# Patient Record
Sex: Male | Born: 1990 | Race: Black or African American | Hispanic: No | Marital: Single | Smoking: Former smoker
Health system: Southern US, Community
[De-identification: ages and names within clinical notes are randomized; demographics above are authoritative.]

## PROBLEM LIST (undated history)

## (undated) DIAGNOSIS — F32A Depression, unspecified: Secondary | ICD-10-CM

## (undated) DIAGNOSIS — F209 Schizophrenia, unspecified: Secondary | ICD-10-CM

## (undated) DIAGNOSIS — F329 Major depressive disorder, single episode, unspecified: Secondary | ICD-10-CM

## (undated) DIAGNOSIS — J309 Allergic rhinitis, unspecified: Secondary | ICD-10-CM

## (undated) DIAGNOSIS — J020 Streptococcal pharyngitis: Secondary | ICD-10-CM

## (undated) HISTORY — DX: Allergic rhinitis, unspecified: J30.9

## (undated) HISTORY — DX: Streptococcal pharyngitis: J02.0

## (undated) HISTORY — PX: ORCHIOPEXY: SHX479

---
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
## 2013-04-08 DIAGNOSIS — J020 Streptococcal pharyngitis: Secondary | ICD-10-CM

## 2013-04-08 HISTORY — DX: Streptococcal pharyngitis: J02.0

## 2013-04-28 ENCOUNTER — Emergency Department (HOSPITAL_COMMUNITY)
Admission: EM | Admit: 2013-04-28 | Discharge: 2013-04-28 | Disposition: A | Discharge status: Home / Self Care | Payer: Medicaid Other | Attending: Emergency Medicine | Admitting: Emergency Medicine

## 2013-04-28 ENCOUNTER — Encounter (HOSPITAL_COMMUNITY): Payer: Self-pay | Admitting: Emergency Medicine

## 2013-04-28 DIAGNOSIS — J020 Streptococcal pharyngitis: Secondary | ICD-10-CM | POA: Insufficient documentation

## 2013-04-28 LAB — RAPID STREP SCREEN (MED CTR MEBANE ONLY): Streptococcus, Group A Screen (Direct): POSITIVE — AB

## 2013-04-28 MED ORDER — PENICILLIN G BENZATHINE 1200000 UNIT/2ML IM SUSP
1.2000 10*6.[IU] | Freq: Once | INTRAMUSCULAR | Status: AC
  Administered 2013-04-28: 1.2 10*6.[IU] via INTRAMUSCULAR
  Filled 2013-04-28: qty 2

## 2013-04-28 MED ORDER — IBUPROFEN 400 MG PO TABS
800.0000 mg | ORAL_TABLET | Freq: Once | ORAL | Status: AC
  Administered 2013-04-28: 800 mg via ORAL
  Filled 2013-04-28: qty 2

## 2013-04-28 NOTE — ED Provider Notes (Signed)
CSN: [id number]     Arrival date & time 04/28/13  1755 History   This chart was scribed for non-physician practitioner, Coral CeoJessica Qusay Villada, PA-C, working with Gavin PoundMichael Y. Oletta LamasGhim, MD by Smiley HousemanFallon Davis, ED Scribe. This patient was seen in room TR11C/TR11C and the patient's care was started at 6:36 PM.  Chief Complaint  Patient presents with  . Sore Throat   The history is provided by the patient. No language interpreter was used.   HPI Comments: [name] is a 23 y.o. male with no PMH who presents to the Emergency Department complaining of a constant worsening sore throat that started about 4 days ago.  He states he had a subjective fever at home.  ED temperature is 99.63F.  He denies cough, otalgia, nausea, emesis, and abdominal pain.  Pt denies taking anything for symptoms relief.  Pt denies any sick contacts at home.  He states he is otherwise healthy.    History reviewed. No pertinent past medical history. History reviewed. No pertinent past surgical history. No family history on file. History  Substance Use Topics  . Smoking status: Never Smoker   . Smokeless tobacco: Not on file  . Alcohol Use: No    Review of Systems  Constitutional: Positive for fever (low grade). Negative for chills, activity change and appetite change.  HENT: Positive for sore throat. Negative for congestion, ear pain, rhinorrhea, trouble swallowing and voice change.   Respiratory: Negative for cough and wheezing.   Cardiovascular: Negative for chest pain.  Gastrointestinal: Negative for nausea, vomiting, abdominal pain and diarrhea.  Musculoskeletal: Negative for myalgias.  Skin: Negative for rash.  Neurological: Negative for headaches.  All other systems reviewed and are negative.   Allergies  Review of patient's allergies indicates no known allergies.  Home Medications   Prior to Admission medications   Not on File   Triage Vitals: BP 159/92  Pulse 94  Temp(Src) 99 F (37.2 C) (Oral)  Resp 18   Ht 6\' 1"  (1.854 m)  Wt 216 lb (97.977 kg)  BMI 28.50 kg/m2  SpO2 99%  Filed Vitals:   04/28/13 1804 04/28/13 1845  BP: 159/92 147/83  Pulse: 94 85  Temp: 99 F (37.2 C)   TempSrc: Oral   Resp: 18 16  Height: 6\' 1"  (1.854 m)   Weight: 216 lb (97.977 kg)   SpO2: 99% 100%    Physical Exam  Nursing note and vitals reviewed. Constitutional: He is oriented to person, place, and time. He appears well-developed and well-nourished. No distress.  Non-toxic  HENT:  Head: Normocephalic and atraumatic.  Right Ear: External ear normal.  Left Ear: External ear normal.  Nose: Nose normal.  Mouth/Throat: Oropharynx is clear and moist.  Erythema, edema and exudates to the tonsils bilaterally. Tonsils 2+ bilaterally. Uvula midline. No trismus. No difficulty controlling secretions. Tympanic membranes gray and translucent bilaterally with no erythema, edema, or hemotympanum.  No mastoid or tragal tenderness bilaterally.   Eyes: Conjunctivae are normal. Pupils are equal, round, and reactive to light. Right eye exhibits no discharge. Left eye exhibits no discharge.  Neck: Normal range of motion. Neck supple.  No cervical lymphadenopathy. No nuchal rigidity.   Cardiovascular: Normal rate, regular rhythm and normal heart sounds.  Exam reveals no gallop and no friction rub.   No murmur heard. Pulmonary/Chest: Effort normal and breath sounds normal. No respiratory distress. He has no wheezes. He has no rales. He exhibits no tenderness.  Abdominal: Soft. He exhibits no distension. There is  no tenderness.  Musculoskeletal: Normal range of motion. He exhibits no edema and no tenderness.  Neurological: He is alert and oriented to person, place, and time.  Skin: Skin is warm and dry. He is not diaphoretic.     ED Course  Procedures (including critical care time) DIAGNOSTIC STUDIES:. Oxygen Saturation is 99% on RA, normal by my interpretation.    COORDINATION OF CARE: 6:40 PM-Informed pt his strep  screen was positive.  Will order penicillin injection and ibuprofen.  Patient informed of current plan of treatment and evaluation and agrees with plan.    Results for orders placed during the hospital encounter of 04/28/13  RAPID STREP SCREEN      Result Value Ref Range   Streptococcus, Group A Screen (Direct) POSITIVE (*) NEGATIVE    MDM   [name] is a 23 y.o. male with no PMH who presents to the Emergency Department complaining of a constant worsening sore throat that started about 4 days ago, which is likely due to strep pharyngitis. Rapid strep positive. Patient treated with IM penicillin in the ED. Patient afebrile and non-toxic in appearance. No evidence of peritonsillar or retropharyngeal abscess. Able to tolerate fluids without difficulty. Instructed to follow-up with PCP if symptoms not improving or worsening. Return precautions, discharge instructions, and follow-up was discussed with the patient before discharge.     There are no discharge medications for this patient.   Final impressions: 1. Strep pharyngitis       Greer EeJessica Katlin Launa Goedken PA-C           Jillyn LedgerJessica K Kayci [place], PA-C 05/01/13 2013

## 2013-04-28 NOTE — Discharge Instructions (Signed)
Drink fluids and rest  Take Ibuprofen for pain  Follow-up with your doctor if your symptoms are not improving or worsening  Return to the emergency department if you develop any changing/worsening condition, fever, difficulty swallowing/breathing, or any other concerns (please read additional information regarding your condition below)    Strep Throat Strep throat is an infection of the throat caused by a bacteria named Streptococcus pyogenes. Your caregiver may call the infection streptococcal "tonsillitis" or "pharyngitis" depending on whether there are signs of inflammation in the tonsils or back of the throat. Strep throat is most common in children aged 5 15 years during the cold months of the year, but it can occur in people of any age during any season. This infection is spread from person to person (contagious) through coughing, sneezing, or other close contact. SYMPTOMS   Fever or chills.  Painful, swollen, red tonsils or throat.  Pain or difficulty when swallowing.  White or yellow spots on the tonsils or throat.  Swollen, tender lymph nodes or "glands" of the neck or under the jaw.  Red rash all over the body (rare). DIAGNOSIS  Many different infections can cause the same symptoms. A test must be done to confirm the diagnosis so the right treatment can be given. A "rapid strep test" can help your caregiver make the diagnosis in a few minutes. If this test is not available, a light swab of the infected area can be used for a throat culture test. If a throat culture test is done, results are usually available in a day or two. TREATMENT  Strep throat is treated with antibiotic medicine. HOME CARE INSTRUCTIONS   Gargle with 1 tsp of salt in 1 cup of warm water, 3 4 times per day or as needed for comfort.  Family members who also have a sore throat or fever should be tested for strep throat and treated with antibiotics if they have the strep infection.  Make sure everyone in  your household washes their hands well.  Do not share food, drinking cups, or personal items that could cause the infection to spread to others.  You may need to eat a soft food diet until your sore throat gets better.  Drink enough water and fluids to keep your urine clear or pale yellow. This will help prevent dehydration.  Get plenty of rest.  Stay home from school, daycare, or work until you have been on antibiotics for 24 hours.  Only take over-the-counter or prescription medicines for pain, discomfort, or fever as directed by your caregiver.  If antibiotics are prescribed, take them as directed. Finish them even if you start to feel better. SEEK MEDICAL CARE IF:   The glands in your neck continue to enlarge.  You develop a rash, cough, or earache.  You cough up green, yellow-brown, or bloody sputum.  You have pain or discomfort not controlled by medicines.  Your problems seem to be getting worse rather than better. SEEK IMMEDIATE MEDICAL CARE IF:   You develop any new symptoms such as vomiting, severe headache, stiff or painful neck, chest pain, shortness of breath, or trouble swallowing.  You develop severe throat pain, drooling, or changes in your voice.  You develop swelling of the neck, or the skin on the neck becomes red and tender.  You have a fever.  You develop signs of dehydration, such as fatigue, dry mouth, and decreased urination.  You become increasingly sleepy, or you cannot wake up completely. Document Released: 12/23/1999 Document  Revised: 12/12/2011 Document Reviewed: 02/23/2010 [facility] Patient Information 2014 [place], [place].   Emergency Department Resource Guide 1) Find a Doctor and Pay Out of Pocket Although you won't have to find out who is covered by your insurance plan, it is a good idea to ask around and get recommendations. You will then need to call the office and see if the doctor you have chosen will accept you as a new patient and what  types of options they offer for patients who are self-pay. Some doctors offer discounts or will set up payment plans for their patients who do not have insurance, but you will need to ask so you aren't surprised when you get to your appointment.  2) Contact Your Local Health Department Not all health departments have doctors that can see patients for sick visits, but many do, so it is worth a call to see if yours does. If you don't know where your local health department is, you can check in your phone book. The CDC also has a tool to help you locate your state's health department, and many state websites also have listings of all of their local health departments.  3) Find a Walk-in Clinic If your illness is not likely to be very severe or complicated, you may want to try a walk in clinic. These are popping up all over the country in pharmacies, drugstores, and shopping centers. They're usually staffed by nurse practitioners or physician assistants that have been trained to treat common illnesses and complaints. They're usually fairly quick and inexpensive. However, if you have serious medical issues or chronic medical problems, these are probably not your best option.  No Primary Care Doctor: - Call Health Connect at  [phone] - they can help you locate a primary care doctor that  accepts your insurance, provides certain services, etc. - Physician Referral Service- [phone]  Chronic Pain Problems: Organization         Address  Phone   Notes  [facility]  [phone] Patients need to be referred by their primary care doctor.   Medication Assistance: Organization         Address  Phone   Notes  [facility] Medication [facility] [address] [place], [place] [address] [phone] --Must be a resident of [facility] -- Must have NO insurance coverage whatsoever (no Medicaid/ Medicare, etc.) -- The pt. MUST have a primary care doctor that  directs their care regularly and follows them in the community   [facility]ist  [phone]   [employer]  [phone]    Agencies that provide inexpensive medical care: Organization         Address  Phone   Notes  [facility]  [phone]   [facility]    [phone]   [facility] [address] [place], [place] [address] [phone]   [facility] [address], [place] [phone]   Planned Parenthood    [phone]   [facility]    [phone]   Community Health and [facility]  [address], [place] Phone:  [phone], Fax:  [phone] Hours of Operation:  9 am - 6 pm, M-F.  Also accepts Medicaid/Medicare and self-pay.  [facility]  [address], [place] Phone: [phone], Fax: [phone]. Hours of Operation:  8:30 am -  5:30 pm, M-F.  Also accepts Medicaid and self-pay.  [facility] [place] [address], [place] Phone: [phone]   [facility] [address] Natasha Bence [place], [place] [phone], Ext. [phone] Mondays & Thursdays: 7-9 AM.  First 15 patients are seen on a first come, first serve basis.    Medicaid-accepting [facility] Providers:  Organization         Address  Phone   Notes  [facility] [address], [place] [phone] Also accepts self-pay patients.  [facility] [address] Laurell Josephs [place], [place]  [phone]   [facility] [address], [place] [phone]   [facility] [address], [place] [phone]   Renaye Rakers [address], [place]   [phone] Only accepts [place] patients after they have their name applied to their card.   Self-Pay (no insurance) in [facility]:  Organization          Address  Phone   Notes  Sickle Cell Patients, [facility] [address] [place], [place] [phone]   [facility] Urgent Care [address] [place], [place] [phone]   [facility] [place]  [address],  [phone]   [facility]/Dr. Osei-Bonsu  [address], [place] or [address], [place] [phone] Phone number for both [place] and [place] locations is the same.  [facility]gent [facility] and [facility] [address] [place] [phone]   [facility] [address] [place] or [address] [phone]   [facility] [address] [place] [phone], phone; [phone], fax Sees patients 1st and 3rd Saturday of every month.  Must not qualify for public or private insurance (i.e. Medicaid, Medicare, [place] Health Choice, Veterans' Benefits)  Household income should be no more than 200% of the poverty level The clinic cannot treat you if you are pregnant or think you are pregnant  Sexually transmitted diseases are not treated at the clinic.    Dental Care: Organization         Address  Phone  Notes  [facility] Department of [facility] [address] [place], [place] [phone] Accepts children up to age 19 who are enrolled in [place] or [place] Health Choice; pregnant women with a Medicaid card; and children who have applied for Medicaid or [place] Health Choice, but were declined, whose parents can pay a reduced fee at time of service.  [facility] Department of [facility]  [address], [place] [phone] Accepts children up to age 10 who are enrolled in [place] or [place] Health Choice; pregnant women with a Medicaid card; and children who have applied for Medicaid or [place] Health Choice, but were declined, whose parents can pay a reduced fee at time of  service.  [facility]  [address] [place], [place] [phone] Patients are seen by appointment only. Walk-ins are not accepted. [facility] will see patients 36 years of age and older. Monday - Tuesday (8am-5pm) Most Wednesdays (8:30-5pm) $30 per visit, cash only  [facility] Adult Dental Access PROGRAM  [address], [facility] [phone] Patients are seen by appointment only. Walk-ins are  not accepted. [facility] will see patients 34 years of age and older. One Wednesday Evening (Monthly: Volunteer Based).  $30 per visit, cash only  [employer]  [phone] for adults; Children under age 60, call Graduate Pediatric Dentistry at [phone]. Children aged 71-14, please call [phone] to request a pediatric application.  Dental services are provided in all areas of dental care including fillings, crowns and bridges, complete and partial dentures, implants, gum treatment, root canals, and extractions. Preventive care is also provided. Treatment is provided to both adults and children. Patients are selected via a lottery and there is often a waiting list.   [facility] [address], [place]  [phone] [email or web address]   [facility] [address] [place], [place] [phone], Ext. [phone] Second and Fourth Thursday of each month, opens at 6:30 AM; Clinic ends at 9 AM.  Patients are seen on a first-come first-served basis, and a limited number are seen during each clinic.   [facility]  [address] Ether Griffins [place], [place] [phone]   Eligibility Requirements You must have lived in [place], [place], or [place] counties for at least the last three months.   You cannot be eligible for state or federal sponsored [place], including [employer], [place], or [employer].   You generally cannot be eligible for healthcare insurance through your employer.     How to apply: Eligibility screenings are held every Tuesday and Wednesday afternoon from 1:00 pm until 4:00 pm. You do not need an appointment for the interview!  [facility] [address], [place], [place] [phone]   [facility] Health Department  [phone]   [facility] Health Department  [phone]   [facility] Health Department  [phone]    Behavioral Health Resources in the Community: Intensive Outpatient Programs Organization         Address  Phone  Notes  [facility] [address], [place], [place] [phone]   [facility] Outpatient [address], [place], [place] [phone]   [facility] [address], [place], [place]  [phone]   [facility] Mental Health [address]  [place], [place] [phone] or [phone]   Substance Abuse Resources Organization         Address  Phone  Notes  [facility] Drug Services  [phone]   [facility]  [phone]   [facility]  [phone]   Floydene Flock  [phone]   Residential & Outpatient Substance Abuse Program  [phone]   Psychological Services Organization         Address  Phone  Notes  [facility] Behavioral Health  [phone]   [facility]  [phone]   [facility] Mental Health [address], [place] [phone] or [phone]    Mobile Crisis Teams Organization         Address  Phone  Notes  [facility], Mobile Crisis Care Unit  [phone]   [facility]  [address] [place], [place] [phone]   Doristine Locks [address] [place] [phone]    Self-Help/Support Groups Organization         Address  Phone             Notes  Mental Health Assoc. of  - variety of support groups  336- I7437963 Call for more information  Narcotics Anonymous (NA), Caring  Services [address], [place]  2 meetings at this location   Residential Sports administratorTreatment Programs Organization         Address  Phone  Notes  [facility] [address],    [place]  [phone]   [facility]  [address], [place] [phone], [place], [place] [phone]   [facility] [address], [place] [phone] Admissions: 8am-3pm M-F  [facility] [address]    [place], [place] [phone]   [facility] [address], [place], [place] [phone]   [facility] [address]  [place], [place] [phone]   [facility] - Intensive Outpatient [address], [place], [place] [phone]   [facility] ([facility] [address]  [place], [place] [phone] or [phone]   [facility] ([facility]) [address] [place], [place] [phone] Accepts Medicaid  [facility] [address]  [place] [phone] Substance Abuse/Addiction Treatment   [facility] Behavioral Health Resources Organization         Address  Phone  Notes  [facility]  [phone](888) [phone]   Angie FavaJulie Brannon, PhD [address] [place], [place]   [phone](336) [phone] or [phone](336) [phone]   [facility]   [address] [place], [place] [phone](336) [phone]   [facility] [address], [place], [place] [phone](336) [phone] Insurance/Medicaid/sponsorship through [facility]  [facility] [address]                                    [place], [place] [phone](336) [phone] Therapy/tele-psych/case  [facility] [address]   [place], [place] [phone](336) [phone]    Dr. Lolly MustacheArfeen  [phone](336) [phone]   [facility] [place]  [employer] [facility] 1) [address], [place] 2) [address], [place] 3)  [address], [place] [phone](336) [phone](336) [phone]  [phone](336) [phone]   [facility] Child Abuse  Hotline [phone](336) [phone] or [phone](336) [phone] (After Hours)

## 2013-04-28 NOTE — ED Notes (Signed)
Onset Saturday morning sore throat, fever.  No cold/cough symptoms.  No fever since yesterday.  Pain getting worse.  Mouth moist, urinating WNL.  No other s/s noted.

## 2013-04-28 NOTE — ED Notes (Signed)
Pt states he believes he has strep throat.  Pt c/o sore throat that started Saturday.

## 2013-04-28 NOTE — ED Notes (Signed)
Drank glass of water with no difficulties. No c/o nausea.

## 2013-05-02 NOTE — ED Provider Notes (Signed)
Medical screening examination/treatment/procedure(s) were performed by non-physician practitioner and as supervising physician I was immediately available for consultation/collaboration.  Nahal Wanless Y. Kaeson Kleinert, MD 05/02/13 1420 

## 2013-09-16 ENCOUNTER — Ambulatory Visit (INDEPENDENT_AMBULATORY_CARE_PROVIDER_SITE_OTHER): Payer: No Typology Code available for payment source | Admitting: Medical

## 2013-09-16 ENCOUNTER — Encounter: Payer: Self-pay | Admitting: Medical

## 2013-09-16 VITALS — BP 120/70 | HR 66 | Temp 97.9°F | Resp 16 | Ht 71.0 in | Wt 183.0 lb

## 2013-09-16 DIAGNOSIS — M67432 Ganglion, left wrist: Secondary | ICD-10-CM

## 2013-09-16 DIAGNOSIS — R519 Headache, unspecified: Secondary | ICD-10-CM

## 2013-09-16 DIAGNOSIS — H6590 Unspecified nonsuppurative otitis media, unspecified ear: Secondary | ICD-10-CM

## 2013-09-16 DIAGNOSIS — J329 Chronic sinusitis, unspecified: Secondary | ICD-10-CM

## 2013-09-16 DIAGNOSIS — Z23 Encounter for immunization: Secondary | ICD-10-CM

## 2013-09-16 DIAGNOSIS — R51 Headache: Secondary | ICD-10-CM

## 2013-09-16 DIAGNOSIS — R0982 Postnasal drip: Secondary | ICD-10-CM

## 2013-09-16 DIAGNOSIS — H6593 Unspecified nonsuppurative otitis media, bilateral: Secondary | ICD-10-CM

## 2013-09-16 DIAGNOSIS — R21 Rash and other nonspecific skin eruption: Secondary | ICD-10-CM

## 2013-09-16 DIAGNOSIS — Z0000 Encounter for general adult medical examination without abnormal findings: Secondary | ICD-10-CM

## 2013-09-16 DIAGNOSIS — Z113 Encounter for screening for infections with a predominantly sexual mode of transmission: Secondary | ICD-10-CM

## 2013-09-16 DIAGNOSIS — M6740 Ganglion, unspecified site: Secondary | ICD-10-CM

## 2013-09-16 LAB — POCT URINALYSIS DIPSTICK
BILIRUBIN UA: NEGATIVE
Blood, UA: NEGATIVE
GLUCOSE UA: NEGATIVE
Ketones, UA: NEGATIVE
Leukocytes, UA: NEGATIVE
NITRITE UA: NEGATIVE
Protein, UA: NEGATIVE
SPEC GRAV UA: 1.025
Urobilinogen, UA: NEGATIVE
pH, UA: 5

## 2013-09-16 LAB — LIPID PANEL
CHOL/HDL RATIO: 3.7 ratio
Cholesterol: 180 mg/dL (ref 0–200)
HDL: 49 mg/dL (ref >39)
LDL CALC: 112 mg/dL — AB (ref 0–99)
Triglycerides: 93 mg/dL (ref <150)
VLDL: 19 mg/dL (ref 0–40)

## 2013-09-16 LAB — COMPREHENSIVE METABOLIC PANEL
ALBUMIN: 5 g/dL (ref 3.5–5.2)
ALT: 13 U/L (ref 0–53)
AST: 16 U/L (ref 0–37)
Alkaline Phosphatase: 83 U/L (ref 39–117)
BUN: 12 mg/dL (ref 6–23)
CHLORIDE: 104 meq/L (ref 96–112)
CO2: 27 meq/L (ref 19–32)
Calcium: 10.7 mg/dL — ABNORMAL HIGH (ref 8.4–10.5)
Creat: 0.9 mg/dL (ref 0.50–1.35)
Glucose, Bld: 71 mg/dL (ref 70–99)
Potassium: 3.9 mEq/L (ref 3.5–5.3)
Sodium: 140 mEq/L (ref 135–145)
Total Bilirubin: 0.8 mg/dL (ref 0.2–1.2)
Total Protein: 7.5 g/dL (ref 6.0–8.3)

## 2013-09-16 MED ORDER — TERBINAFINE HCL 1 % EX CREA: 1.0000 "application " | TOPICAL_CREAM | Freq: Two times a day (BID) | CUTANEOUS | Status: DC

## 2013-09-16 MED ORDER — AMOXICILLIN 875 MG PO TABS: 875.0000 mg | ORAL_TABLET | Freq: Two times a day (BID) | ORAL | Status: DC

## 2013-09-16 NOTE — Patient Instructions (Signed)
Thank you for giving me the opportunity to serve you today.    Your diagnosis today includes: Encounter Diagnoses  Name Primary?  . Routine general medical examination at a health care facility Yes  . Ganglion cyst of wrist, left   . Rash and nonspecific skin eruption   . Bilateral serous otitis media, recurrence not specified, unspecified chronicity   . Headache, unspecified headache type   . Post-nasal drainage   . Screen for STD (sexually transmitted disease)      Specific recommendations today include:  Headaches, drainage down the back of the throat, mucus - begin amoxicillin twice a day for 10 days, begin either Benadryl or Mucinex over-the-counter for the next 5-10 days, increase water intake, and if not much improved within 10 days let me know  If still having headaches 2-3 weeks from now, then certainly recheck   we are doing baseline lab work today  Begin Lamisil cream for likely ringworm of your left abdomen.  It may take 2-3 weeks for this to resolve  The left wrist knot is a ganglion cyst.  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   I have included other useful information below for your review.  Sexually Transmitted Disease Sexually transmitted disease (STD) refers to any infection that is passed from person to person during sexual activity. This may happen by way of saliva, semen, blood, vaginal mucus, or urine. Common STDs include:  Gonorrhea.   Chlamydia.   Syphilis.   HIV/AIDS.   Genital herpes.   Hepatitis B and C.   Trichomonas.   Human papillomavirus (HPV).   Pubic lice.  CAUSES  An STD may be spread by bacteria, virus, or parasite. A person can get an STD by:  Sexual intercourse with an infected person.   Sharing sex toys with an infected person.   Sharing needles with an infected  person.   Having intimate contact with the genitals, mouth, or rectal areas of an infected person.  SYMPTOMS  Some people may not have any symptoms, but they can still pass the infection to others. Different STDs have different symptoms. Symptoms include:  Painful or bloody urination.   Pain in the pelvis, abdomen, vagina, anus, throat, or eyes.   Skin rash, itching, irritation, growths, or sores (lesions). These usually occur in the genital or anal area.   Abnormal vaginal discharge.   Penile discharge in men.   Soft, flesh-colored skin growths in the genital or anal area.   Fever.   Pain or bleeding during sexual intercourse.   Swollen glands in the groin area.   Yellow skin and eyes (jaundice). This is seen with hepatitis.  DIAGNOSIS  To make a diagnosis, your caregiver may:  Take a medical history.   Perform a physical exam.   Take a specimen (culture) to be examined.   Examine a sample of discharge under a microscope.   Perform blood tests.   Perform a Pap test, if this applies.   Perform a colposcopy.   Perform a laparoscopy.  TREATMENT   Chlamydia, gonorrhea, trichomonas, and syphilis can be cured with antibiotic medicine.   Genital herpes, hepatitis, and HIV can be treated, but not cured, with prescribed medicines. The medicines will lessen the symptoms.   Genital warts from HPV can be treated with medicine or by freezing,  burning (electrocautery), or surgery. Warts may come back.   HPV is a virus and cannot be cured with medicine or surgery.However, abnormal areas may be followed very closely by your caregiver and may be removed from the cervix, vagina, or vulva through office procedures or surgery.  If your diagnosis is confirmed, your recent sexual partners need treatment. This is true even if they are symptom-free or have a negative culture or evaluation. They should not have sex until their caregiver says it is okay. HOME CARE INSTRUCTIONS  All  sexual partners should be informed, tested, and treated for all STDs.   Take your antibiotics as directed. Finish them even if you start to feel better.   Only take over-the-counter or prescription medicines for pain, discomfort, or fever as directed by your caregiver.   Rest.   Eat a balanced diet and drink enough fluids to keep your urine clear or pale yellow.   Do not have sex until treatment is completed and you have followed up with your caregiver. STDs should be checked after treatment.   Keep all follow-up appointments, Pap tests, and blood tests as directed by your caregiver.   Only use latex condoms and water-soluble lubricants during sexual activity. Do not use petroleum jelly or oils.   Avoid alcohol and illegal drugs.   Get vaccinated for HPV and hepatitis. If you have not received these vaccines in the past, talk to your caregiver about whether one or both might be right for you.   Avoid risky sex practices that can break the skin.  The only way to avoid getting an STD is to avoid all sexual activity.Latex condoms and dental dams (for oral sex) will help lessen the risk of getting an STD, but will not completely eliminate the risk. SEEK MEDICAL CARE IF:   You have a fever.   You have any new or worsening symptoms.  Document Released: 03/17/2002 Document Revised: 09/06/2010 Document Reviewed: 03/24/2010 [facility] Patient Information 2012 [place], [place].    Body Ringworm Ringworm (tinea corporis) is a fungal infection of the skin on the body. This infection is not caused by worms, but is actually caused by a fungus. Fungus normally lives on the top of your skin and can be useful. However, in the case of ringworms, the fungus grows out of control and causes a skin infection. It can involve any area of skin on the body and can spread easily from one person to another (contagious). Ringworm is a common problem for children, but it can affect adults as well. Ringworm is also  often found in athletes, especially wrestlers who share equipment and mats.  CAUSES  Ringworm of the body is caused by a fungus called dermatophyte. It can spread by:  Touchingother people who are infected.  Touchinginfected pets.  Touching or sharingobjects that have been in contact with the infected person or pet (hats, combs, towels, clothing, sports equipment). SYMPTOMS   Itchy, raised red spots and bumps on the skin.  Ring-shaped rash.  Redness near the border of the rash with a clear center.  Dry and scaly skin on or around the rash. Not every person develops a ring-shaped rash. Some develop only the red, scaly patches. DIAGNOSIS  Most often, ringworm can be diagnosed by performing a skin exam. Your caregiver may choose to take a skin scraping from the affected area. The sample will be examined under the microscope to see if the fungus is present.  TREATMENT  Body ringworm may be treated with  a topical antifungal cream or ointment. Sometimes, an antifungal shampoo that can be used on your body is prescribed. You may be prescribed antifungal medicines to take by mouth if your ringworm is severe, keeps coming back, or lasts a long time.  HOME CARE INSTRUCTIONS   Only take over-the-counter or prescription medicines as directed by your caregiver.  Wash the infected area and dry it completely before applying yourcream or ointment.  When using antifungal shampoo to treat the ringworm, leave the shampoo on the body for 3-5 minutes before rinsing.   Wear loose clothing to stop clothes from rubbing and irritating the rash.  Wash or change your bed sheets every night while you have the rash.  Have your pet treated by your veterinarian if it has the same infection. To prevent ringworm:   Practice good hygiene.  Wear sandals or shoes in public places and showers.  Do not share personal items with others.  Avoid touching red patches of skin on other people.  Avoid  touching pets that have bald spots or wash your hands after doing so. SEEK MEDICAL CARE IF:   Your rash continues to spread after 7 days of treatment.  Your rash is not gone in 4 weeks.  The area around your rash becomes red, warm, tender, and swollen. Document Released: 12/23/1999 Document Revised: 09/19/2011 Document Reviewed: 07/09/2011 [facility] Patient Information 2015 [place], [place]. This information is not intended to replace advice given to you by your health care provider. Make sure you discuss any questions you have with your health care provider.   Ganglion Cyst A ganglion cyst is a noncancerous, fluid-filled lump that occurs near joints or tendons. The ganglion cyst grows out of a joint or the lining of a tendon. It most often develops in the hand or wrist but can also develop in the shoulder, elbow, hip, knee, ankle, or foot. The round or oval ganglion can be pea sized or larger than a grape. Increased activity may enlarge the size of the cyst because more fluid starts to build up.  CAUSES  It is not completely known what causes a ganglion cyst to grow. However, it may be related to:  Inflammation or irritation around the joint.  An injury.  Repetitive movements or overuse.  Arthritis. SYMPTOMS  A lump most often appears in the hand or wrist, but can occur in other areas of the body. Generally, the lump is painless without other symptoms. However, sometimes pain can be felt during activity or when pressure is applied to the lump. The lump may even be tender to the touch. Tingling, pain, numbness, or muscle weakness can occur if the ganglion cyst presses on a nerve. Your grip may be weak and you may have less movement in your joints.  DIAGNOSIS  Ganglion cysts are most often diagnosed based on a physical exam, noting where the cyst is and how it looks. Your caregiver will feel the lump and may shine a light alongside it. If it is a ganglion, a light often shines through it.  Your caregiver may order an X-ray, ultrasound, or MRI to rule out other conditions. TREATMENT  Ganglions usually go away on their own without treatment. If pain or other symptoms are involved, treatment may be needed. Treatment is also needed if the ganglion limits your movement or if it gets infected. Treatment options include:  Wearing a wrist or finger brace or splint.  Taking anti-inflammatory medicine.  Draining fluid from the lump with a needle (aspiration).  Injecting a steroid into the joint.  Surgery to remove the ganglion cyst and its stalk that is attached to the joint or tendon. However, ganglion cysts can grow back. HOME CARE INSTRUCTIONS   Do not press on the ganglion, poke it with a needle, or hit it with a heavy object. You may rub the lump gently and often. Sometimes fluid moves out of the cyst.  Only take medicines as directed by your caregiver.  Wear your brace or splint as directed by your caregiver. SEEK MEDICAL CARE IF:   Your ganglion becomes larger or more painful.  You have increased redness, red streaks, or swelling.  You have pus coming from the lump.  You have weakness or numbness in the affected area. MAKE SURE YOU:   Understand these instructions.  Will watch your condition.  Will get help right away if you are not doing well or get worse. Document Released: 12/23/1999 Document Revised: 09/19/2011 Document Reviewed: 02/18/2007 [facility] Patient Information 2015 [place], [place]. This information is not intended to replace advice given to you by your health care provider. Make sure you discuss any questions you have with your health care provider.

## 2013-09-16 NOTE — Progress Notes (Signed)
Subjective:   HPI  [name] is a 23 y.o. male who presents for a complete physical.  New patient today, here today along with twin brother for physicals and to establish care.   Preventative care: Last ophthalmology visit: n/a Last dental visit:yes Prior vaccinations: TD or Tdap:2009  Concerns: Left dorsal wrist with knot been there a while, interferes with activity and basektabll.  Wants this checked out.   Rash of abdomen, thinks he contracted this from brother who has similar rash all over his torso.    Gets allergies in the spring.  He has concerns about headaches.  He was seen in the emergency department and held overnight for fever and strep throat back in April. Since then he has had headaches when he gets up or stands up, as more in throat mucus that is irritating and this has persisted since April. Prior to April never had this problem. He denies syncope, fainting, palpitations, lightheadedness, dizziness, numbness, tingling, weakness, nausea, vomiting. No prior similar otherwise  Reviewed their medical, surgical, family, social, medication, and allergy history and updated chart as appropriate.  Past Medical History  Diagnosis Date  . Allergic rhinitis     mostly spring  . Strep throat 04/2013    obervation in ED due to high fever, illness    Past Surgical History  Procedure Laterality Date  . Orchiopexy      bilat, in childhood    History   Social History  . Marital Status: Single    Spouse Name: N/A    Number of Children: N/A  . Years of Education: N/A   Occupational History  . Not on file.   Social History Main Topics  . Smoking status: Never Smoker   . Smokeless tobacco: Not on file  . Alcohol Use: No  . Drug Use: No  . Sexual Activity: Yes    Birth Control/ Protection: None   Other Topics Concern  . Not on file   Social History Narrative   Lives with parents, from [place], high school at [employer], [place] for a while, dropped  out.  Wants to go into HVAC work.  Exercise - plays basketball regularly.  Currently trying to find work to [employer] to return to school.       Family History  Problem Relation Age of Onset  . Other Mother     "tumor of stomach"  . Anemia Mother   . Other Father     unknown  . Diabetes Brother     borderline  . Hypertension Maternal Grandmother   . Cancer Neg Hx   . Stroke Neg Hx   . Heart disease Neg Hx   . Aneurysm Neg Hx     Current outpatient prescriptions:terbinafine (LAMISIL AT) 1 % cream, Apply 1 application topically 2 (two) times daily., Disp: 30 g, Rfl: 0  No Known Allergies   Review of Systems Constitutional: -fever, -chills, -sweats, -unexpected weight change, -decreased appetite, -fatigue Allergy: -sneezing, -itching, -congestion Dermatology: -changing moles, --rash, -lumps ENT: -runny nose, -ear pain, -sore throat, -hoarseness, -sinus pain, -teeth pain, - ringing in ears, -hearing loss, -nosebleeds Cardiology: -chest pain, -palpitations, -swelling, -difficulty breathing when lying flat, -waking up short of breath Respiratory: -cough, -shortness of breath, -difficulty breathing with exercise or exertion, -wheezing, -coughing up blood Gastroenterology: -abdominal pain, -nausea, -vomiting, -diarrhea, -constipation, -blood in stool, -changes in bowel movement, -difficulty swallowing or eating Hematology: -bleeding, -bruising  Musculoskeletal: -joint aches, -muscle aches, -joint swelling, -back pain, -  neck pain, -cramping, -changes in gait Ophthalmology: denies vision changes, eye redness, itching, discharge Urology: -burning with urination, -difficulty urinating, -blood in urine, -urinary frequency, -urgency, -incontinence Neurology: -headache, -weakness, -tingling, -numbness, -memory loss, -falls, -dizziness Psychology: -depressed mood, -agitation, -sleep problems     Objective:   Physical Exam  BP 120/70  Pulse 66  Temp(Src) 97.9 F (36.6 C) (Oral)   Resp 16  Ht  (1.803 m)  Wt 183 lb (83.008 kg)  BMI 25.53 kg/m2  General appearance: alert, no distress, WD/WN, lean AA male Skin: tattoos left forearm, right forearm, left lateral antecubital region with arc shaped scars from prior police dog bite, left lower abdomen with 2 round somewhat pinkish lesions, likely tinea, small round scar right low back, 3mm diameter, patchy flat macular brown coloration of right 2nd finger MCP birth mark per patient, no other worrisome lesions HEENT: normocephalic, conjunctiva/corneas normal, sclerae anicteric, serous effusions bilat, otherwise ear canals normal, PERRLA, EOMi, nares patent, no discharge or erythema, pharynx normal Oral cavity: MMM, tongue normal, teeth in good repair Neck: supple, no lymphadenopathy, no thyromegaly, no masses, normal ROM, no bruits Chest: non tender, normal shape and expansion Heart: RRR, normal S1, S2, no murmurs Lungs: CTA bilaterally, no wheezes, rhonchi, or rales Abdomen: +bs, soft, non tender, non distended, no masses, no hepatomegaly, no splenomegaly, no bruits Back: non tender, normal ROM, no scoliosis Musculoskeletal: upper extremities non tender, no obvious deformity, normal ROM throughout, lower extremities non tender, no obvious deformity, normal ROM throughout Extremities: no edema, no cyanosis, no clubbing Pulses: 2+ symmetric, upper and lower extremities, normal cap refill Neurological: alert, oriented x 3, CN2-12 intact, strength normal upper extremities and lower extremities, sensation normal throughout, DTRs 2+ throughout, no cerebellar signs, gait normal Psychiatric: normal affect, behavior normal, pleasant  GU: suprapubic horizontal surgical scar, normal male external genitalia, circumcised, hypogonadal on exam bilat, nontender, no masses, no hernia, no lymphadenopathy Rectal: deferred   Assessment and Plan :   Encounter Diagnoses  Name Primary?  . Routine general medical examination at a health  care facility Yes  . Ganglion cyst of wrist, left   . Rash and nonspecific skin eruption   . Bilateral serous otitis media, recurrence not specified, unspecified chronicity   . Headache, unspecified headache type   . Post-nasal drainage   . Screen for STD (sexually transmitted disease)   . Need for prophylactic vaccination and inoculation against influenza   . Need for HPV vaccination     Physical exam - discussed healthy lifestyle, diet, exercise, preventative care, vaccinations, and addressed their concerns.  Counseled on general safety, College/career planning, see dentist regularly, and baseline labs today.  Ganglion cyst of left dorsal wrist-reassured, advise if this starts to bother him more we can refer to hand surgeon. Watch and wait approach at this time  Rash-most suggestive of tinea given brothers similar but much worse rash of tinea versicolor. Begin Lamisil cream topically and if not resolved within 2-4 weeks and recheck  Serous otitis media, headache, sore throat and mucus-begin round of amoxicillin, Benadryl Mucinex over-the-counter and if not resolved within 2 weeks let me know  Discussed condom use, safe sex, prevention, STDs, means of transmission, prevention of unwanted pregnancy, respect for women.  STD screening labs today.   Patient gives consent for testing.  We will call with lab results.   Counseled on the influenza virus vaccine.  Vaccine information sheet given.  Influenza vaccine given after consent obtained.  Counseled on the Human Papilloma  virus vaccine.  Vaccine information sheet given.  HPV #2 vaccine given after consent obtained.  Patient was advised to return 6 months for HPV #3.    Follow-up pending labs

## 2013-09-17 ENCOUNTER — Other Ambulatory Visit: Payer: Self-pay | Admitting: Medical

## 2013-09-17 LAB — CBC
HCT: 44.2 % (ref 39.0–52.0)
HEMOGLOBIN: 15.3 g/dL (ref 13.0–17.0)
MCH: 28.9 pg (ref 26.0–34.0)
MCHC: 34.6 g/dL (ref 30.0–36.0)
MCV: 83.6 fL (ref 78.0–100.0)
PLATELETS: 341 10*3/uL (ref 150–400)
RBC: 5.29 MIL/uL (ref 4.22–5.81)
RDW: 14.2 % (ref 11.5–15.5)
WBC: 5.9 10*3/uL (ref 4.0–10.5)

## 2013-09-17 LAB — HIV ANTIBODY (ROUTINE TESTING W REFLEX): HIV: NONREACTIVE

## 2013-09-17 LAB — GC/CHLAMYDIA PROBE AMP
CT Probe RNA: NEGATIVE
GC Probe RNA: NEGATIVE

## 2013-09-17 LAB — RPR

## 2013-09-17 NOTE — Progress Notes (Signed)
LM to [facility] 

## 2013-10-12 ENCOUNTER — Telehealth: Payer: Self-pay | Admitting: Medical

## 2013-10-12 NOTE — Telephone Encounter (Signed)
lmom to cb. CLS 

## 2013-10-12 NOTE — Telephone Encounter (Addendum)
Mom states pt is no better, coughing lot of mucous, can't sleep at night.  Taking over the counter Mucinex & it's not helping.  Can you call him in something else, or do you need to see him again?

## 2013-10-12 NOTE — Telephone Encounter (Signed)
At that time he had some post nasal drainage without obvious infection.  Probably should be seen.   Any fever, wheezing, SOB?

## 2014-04-01 ENCOUNTER — Ambulatory Visit: Payer: No Typology Code available for payment source | Admitting: Medical

## 2014-04-06 ENCOUNTER — Ambulatory Visit (INDEPENDENT_AMBULATORY_CARE_PROVIDER_SITE_OTHER): Payer: 59 | Admitting: Medical

## 2014-04-06 ENCOUNTER — Encounter: Payer: Self-pay | Admitting: Medical

## 2014-04-06 VITALS — BP 110/70 | HR 60 | Temp 98.2°F | Resp 16 | Wt 184.0 lb

## 2014-04-06 DIAGNOSIS — M542 Cervicalgia: Secondary | ICD-10-CM

## 2014-04-06 MED ORDER — CYCLOBENZAPRINE HCL 10 MG PO TABS: ORAL_TABLET | ORAL | Status: DC

## 2014-04-06 NOTE — Patient Instructions (Signed)
Neck pain  Recommendations:  You may use heat such as hot shower, heat pad, hot rag to the neck  Use daily stretching routine to the neck  Use OTC Aleve daily for the next 1-2 weeks  Use the Flexeril muscle relaxer, 1/2-1 tablet at bedtime or twice daily if real bad pain in the day time  Sleep with neck in neutral position  If not much improved within a week, lets consider labs and neck xray

## 2014-04-06 NOTE — Progress Notes (Signed)
Subjective: Here for pain in right neck since October.  Denies injury, no trauma.   Hurts all the time, sometimes just a small area hurts, other times larger area of neck hurts.  Not worse with any particular area.  Sometimes notices the pain during sleep.  Has good ROM, but feels pop or stretch when rotating left.   Currently not working or in school.   Usually hanging out at grandmother's house during the day.  No pain in arms, no paresthesias.   No headaches.  Feels a knot in right neck unchanged.  Has used some ice packs.  Was sleeping on right side before this started hurting.  Now sleeps on left.   No other unusual body positioning during the day or night.  No other aggravating or relieving factors. No other complaint.  Objective: BP 110/70 mmHg  Pulse 60  Temp(Src) 98.2 F (36.8 C) (Oral)  Resp 16  Wt 184 lb (83.462 kg)  Gen: wd, wn, nad Skin unremarkable Neck: supple, tender right lateral neck, small palpable lymph node in same area, but no swelling, no other mass, normal neck ROM, no spasm Back: nontender Arms nontender, normal ROM, no deformity No arm edema Arms neurovascularly intact  Assessment: Encounter Diagnosis  Name Primary?  . Neck pain on right side Yes   Plan: Discussed symptoms, concerns.  Gave recommendations as below.  Patient Instructions  Neck pain  Recommendations:  You may use heat such as hot shower, heat pad, hot rag to the neck  Use daily stretching routine to the neck  Use OTC Aleve daily for the next 1-2 weeks  Use the Flexeril muscle relaxer, 1/2-1 tablet at bedtime or twice daily if real bad pain in the day time  Sleep with neck in neutral position  If not much improved within a week, lets consider labs and neck xray

## 2014-07-28 ENCOUNTER — Ambulatory Visit: Payer: 59 | Admitting: Medical

## 2015-11-09 ENCOUNTER — Encounter: Payer: Self-pay | Admitting: Medical

## 2015-11-09 ENCOUNTER — Ambulatory Visit (INDEPENDENT_AMBULATORY_CARE_PROVIDER_SITE_OTHER): Payer: BLUE CROSS/BLUE SHIELD | Admitting: Medical

## 2015-11-09 VITALS — BP 150/88 | HR 73 | Wt 198.0 lb

## 2015-11-09 DIAGNOSIS — T148XXA Other injury of unspecified body region, initial encounter: Secondary | ICD-10-CM

## 2015-11-09 DIAGNOSIS — S39012A Strain of muscle, fascia and tendon of lower back, initial encounter: Secondary | ICD-10-CM | POA: Diagnosis not present

## 2015-11-09 DIAGNOSIS — M62830 Muscle spasm of back: Secondary | ICD-10-CM | POA: Diagnosis not present

## 2015-11-09 MED ORDER — CYCLOBENZAPRINE HCL 10 MG PO TABS: ORAL_TABLET | ORAL | 0 refills | Status: DC

## 2015-11-09 MED ORDER — NAPROXEN 375 MG PO TABS: 375.0000 mg | ORAL_TABLET | Freq: Two times a day (BID) | ORAL | 0 refills | Status: DC

## 2015-11-09 NOTE — Progress Notes (Signed)
Subjective: Chief Complaint  Patient presents with  . back pain    back pain x1 week was lifting weights and felt a pull in his lower back to mid back    Here for back pain.  Was exercising about 1.5 months ago, felt a stretch in his back while lifting weights that didn't feel right.  Was bench pressing at the time.  Had some pain the first several days that seemed minor.  But didn't really start getting worse pain until 2 weeks ago.  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  No hx/o chronic back pain.   currently pain is mid to left side in low back.   Pain worse with movement.   Not moving helps.   No pain radiating down legs or arm.  No paresthesia.  No weakness.  No rash.  No swelling.  Using nothing for symptoms.  Using some ice and heat alternatively.  No other aggravating or relieving factors. No other complaint.  Past Medical History:  Diagnosis Date  . Allergic rhinitis    mostly spring  . Strep throat 04/2013   obervation in ED due to high fever, illness   No current outpatient prescriptions on file prior to visit.   No current facility-administered medications on file prior to visit.    ROS as in subjective   Objective: BP (!) 150/88   Pulse 73   Wt 198 lb (89.8 kg)   SpO2 97%   BMI 27.62 kg/m   BP Readings from Last 3 Encounters:  11/09/15 (!) 150/88  04/06/14 110/70  09/16/13 120/70   Wt Readings from Last 3 Encounters:  11/09/15 198 lb (89.8 kg)  04/06/14 184 lb (83.5 kg)  09/16/13 183 lb (83 kg)    Gen: wd, wn, nad Skin : unremarkable, no rash, no bruising Tender left lower thoracic and upper lumbar region in muscle tissue/soft tissue No midline tenderness, no pain with ROM which is full Arms nontender, normal ROM, no swelling or deformity Legs nontender, normal ROM, no swelling or deformity No edema Neuro: normal UE and LE strength, sensation, DTRs, -SLR Abdomen: nontender, no mass, no  organomegaly    Assessment: Encounter Diagnoses  Name Primary?  . Muscle strain Yes  . Back strain, initial encounter   . Back spasm     Plan: Discussed his injury, exam findings suggestive of mid to low back strain, particular left side.  advised recommendations below  Patient Instructions  Muscle strain of back Muscle spasm of back  Recommendations: You can use heat to the back for comfort You can use an OTC back support /back brace for the next 2 weeks Do a daily head to toe stretching routine Use the strengthen/rehab exercises we discussed such as using 20 repetitive with no or low weight for the following exercises at least 3 times per week  Ab crunches  Back rows  Light weight bench press  Core twists  Pull downs  You can use the flexeril muscle relaxer at bedtime, but caution as this can cause drowsiness Use the Naprosyn twice daily for pain over the next 2 weeks If not much improved in 2-3 weeks, or if worsening, let me know The next step would be physical therapy referral   Gaines was seen today for back pain.  Diagnoses and all orders for this visit:  Muscle strain  Back strain, initial encounter  Back spasm  Other orders -  naproxen (NAPROSYN) 375 MG tablet; Take 1 tablet (375 mg total) by mouth 2 (two) times daily with a meal. -     cyclobenzaprine (FLEXERIL) 10 MG tablet; 1/2-1 tablet po QHS prn

## 2015-11-09 NOTE — Patient Instructions (Signed)
Muscle strain of back Muscle spasm of back  Recommendations: You can use heat to the back for comfort You can use an OTC back support /back brace for the next 2 weeks Do a daily head to toe stretching routine Use the strengthen/rehab exercises we discussed such as using 20 repetitive with no or low weight for the following exercises at least 3 times per week  Ab crunches  Back rows  Light weight bench press  Core twists  Pull downs  You can use the flexeril muscle relaxer at bedtime, but caution as this can cause drowsiness Use the Naprosyn twice daily for pain over the next 2 weeks If not much improved in 2-3 weeks, or if worsening, let me know The next step would be physical therapy referral

## 2016-09-14 ENCOUNTER — Ambulatory Visit (INDEPENDENT_AMBULATORY_CARE_PROVIDER_SITE_OTHER): Payer: BLUE CROSS/BLUE SHIELD | Admitting: Medical

## 2016-09-14 ENCOUNTER — Encounter: Payer: Self-pay | Admitting: Medical

## 2016-09-14 ENCOUNTER — Telehealth: Payer: Self-pay

## 2016-09-14 VITALS — BP 126/82 | HR 94 | Ht 71.0 in | Wt 182.0 lb

## 2016-09-14 DIAGNOSIS — R203 Hyperesthesia: Secondary | ICD-10-CM | POA: Diagnosis not present

## 2016-09-14 DIAGNOSIS — N50819 Testicular pain, unspecified: Secondary | ICD-10-CM

## 2016-09-14 DIAGNOSIS — H9202 Otalgia, left ear: Secondary | ICD-10-CM | POA: Diagnosis not present

## 2016-09-14 DIAGNOSIS — R300 Dysuria: Secondary | ICD-10-CM

## 2016-09-14 DIAGNOSIS — Z113 Encounter for screening for infections with a predominantly sexual mode of transmission: Secondary | ICD-10-CM

## 2016-09-14 DIAGNOSIS — B360 Pityriasis versicolor: Secondary | ICD-10-CM

## 2016-09-14 DIAGNOSIS — Z0000 Encounter for general adult medical examination without abnormal findings: Secondary | ICD-10-CM | POA: Diagnosis not present

## 2016-09-14 LAB — POCT URINALYSIS DIP (PROADVANTAGE DEVICE)
BILIRUBIN UA: NEGATIVE
BILIRUBIN UA: NEGATIVE mg/dL
Blood, UA: NEGATIVE
GLUCOSE UA: NEGATIVE mg/dL
Leukocytes, UA: NEGATIVE
Nitrite, UA: NEGATIVE
SPECIFIC GRAVITY, URINE: 1.03
UUROB: NEGATIVE
pH, UA: 6 (ref 5.0–8.0)

## 2016-09-14 MED ORDER — KETOCONAZOLE 2 % EX SHAM: 1.0000 "application " | MEDICATED_SHAMPOO | CUTANEOUS | 0 refills | Status: DC

## 2016-09-14 NOTE — Telephone Encounter (Signed)
Of note, I saw him today for physical, did screenings, and I inquired, asked several questions in this regard and he denies any issues with mood, depression, anger, agitation, stress.

## 2016-09-14 NOTE — Progress Notes (Signed)
Subjective:   HPI  [name] is a 26 y.o. male who presents for physical Chief Complaint  Patient presents with  . Annual Exam    physical , no other concerns     Medical care team includes: Tysinger, Kermit Balo, PA-C here for primary care Dentist  Concerns: sometime burns with urination.  Last sexual activity few years ago.   No penile discharge.  Testicles ache at times  He notes using cable pull at gym 6 weeks ago, had injury he felt in right lateral chest wall area.   He notes 2 years ago hitting his left ear with ear phone in place.  Since then has had pain regularly.  Reviewed their medical, surgical, family, social, medication, and allergy history and updated chart as appropriate.  Past Medical History:  Diagnosis Date  . Allergic rhinitis    mostly spring  . Strep throat 04/2013   obervation in ED due to high fever, illness    Past Surgical History:  Procedure Laterality Date  . ORCHIOPEXY     bilat, in childhood    Social History   Social History  . Marital status: Single    Spouse name: N/A  . Number of children: N/A  . Years of education: N/A   Occupational History  . Not on file.   Social History Main Topics  . Smoking status: Former Games developer  . Smokeless tobacco: Never Used  . Alcohol use No  . Drug use: Yes    Types: Marijuana  . Sexual activity: Yes    Birth control/ protection: None   Other Topics Concern  . 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   Lives with parents, from [place], high school at [employer], [place] for a while, dropped out.  Wants to go into HVAC work.  Exercise - plays basketball regularly.  Currently trying to find work to [employer] to return to school.   Prior incarceration x 41mo for theft.      Family History  Problem Relation Age of Onset  . Other Mother        "tumor of stomach"  . Anemia Mother   . Other Father        unknown  .  Diabetes Brother        borderline  . Hypertension Maternal Grandmother   . Cancer Neg Hx   . Stroke Neg Hx   . Heart disease Neg Hx   . Aneurysm Neg Hx     No current outpatient prescriptions on file.  No Known Allergies     Review of Systems Constitutional: -fever, -chills, -sweats, -unexpected weight change, -decreased appetite, -fatigue Allergy: -sneezing, -itching, -congestion Dermatology: -changing moles, --rash, -lumps ENT: -runny nose, +left ear pain, -sore throat, -hoarseness, -sinus pain, -teeth pain, - ringing in ears, -hearing loss, -nosebleeds Cardiology: -chest pain, -palpitations, -swelling, -difficulty breathing when lying flat, -waking up short of breath Respiratory: -cough, -shortness of breath, -difficulty breathing with exercise or exertion, -wheezing, -coughing up blood Gastroenterology: -abdominal pain, -nausea, -vomiting, -diarrhea, -constipation, -blood in stool, -changes in bowel movement, -difficulty swallowing or eating Hematology: -bleeding, -bruising  Musculoskeletal: -joint aches, +muscle aches, -joint swelling, -back pain, -neck pain, -cramping, -changes in gait Ophthalmology: denies vision changes, eye redness, itching, discharge Urology: +burning with urination, -difficulty urinating, -blood in urine, -urinary frequency, -urgency, -incontinence Neurology: -headache, -weakness, -tingling, -numbness, -memory loss, -falls, -dizziness Psychology: -depressed mood, -  agitation, -sleep problems     Objective:   BP 126/82   Pulse 94   Ht  (1.803 m)   Wt 182 lb (82.6 kg)   SpO2 97%   BMI 25.38 kg/m   General appearance: alert, no distress, WD/WN, African American male Skin: few well defined rough roundish patch on upper back suggestive of tinea HEENT: normocephalic, conjunctiva/corneas normal, sclerae anicteric, PERRLA, EOMi, nares patent, no discharge or erythema, pharynx normal Oral cavity: MMM, tongue normal, teeth with moderate  plaque Neck: supple, no lymphadenopathy, no thyromegaly, no masses, normal ROM, no bruits Chest: non tender, normal shape and expansion Heart: RRR, normal S1, S2, no murmurs Lungs: CTA bilaterally, no wheezes, rhonchi, or rales Abdomen: +bs, soft, non tender, non distended, no masses, no hepatomegaly, no splenomegaly, no bruits Back: tender right latissimus dorsi area, but tender out of proportion to touch, otherwise, non tender, normal ROM, no scoliosis Musculoskeletal: upper extremities non tender, no obvious deformity, normal ROM throughout, lower extremities non tender, no obvious deformity, normal ROM throughout Extremities: no edema, no cyanosis, no clubbing Pulses: 2+ symmetric, upper and lower extremities, normal cap refill Neurological: alert, oriented x 3, CN2-12 intact, strength normal upper extremities and lower extremities, sensation normal throughout, DTRs 2+ throughout, no cerebellar signs, gait normal Psychiatric: normal affect, behavior normal, pleasant  GU: normal male external genitalia,circumcised, bilat testicles tender, but no mass, no swelling or rash, no hernia, no lymphadenopathy Rectal: deferred   Assessment and Plan :    Encounter Diagnoses  Name Primary?  . Routine general medical examination at a health care facility Yes  . Hyperesthesia   . Testicular pain   . Burning with urination   . Left ear pain   . Screen for STD (sexually transmitted disease)     Physical exam - discussed and counseled on healthy lifestyle, diet, exercise, preventative care, vaccinations, sick and well care, proper use of emergency dept and after hours care, and addressed their concerns.    Health screening: See your eye doctor yearly for routine vision care. See your dentist yearly for routine dental care including hygiene visits twice yearly.  Discussed STD testing, discussed prevention, condom use, means of transmission  Cancer screening Discussed self testicular  exams  Vaccinations: Counseled on the following vaccines:  Influenza, declines Will be due for Td vaccine next year  Acute issues discussed: Right flank pain - tender to touch out of proportion to injury.   Labs today, consider xray of ribs/chest.  Testicular pain - f/u pending labs  Ear pain - unclear etiology, possibly even psychosomatic, not clear.  Hearing test normal.  Labs today.  Separate significant chronic issues discussed: none  Izea was seen today for annual exam.  Diagnoses and all orders for this visit:  Routine general medical examination at a health care facility -     POCT Urinalysis DIP (Proadvantage Device) -     Comprehensive metabolic panel -     CBC -     TSH -     Lipid panel -     RPR -     GC/Chlamydia Probe Amp -     HIV antibody -     Vitamin B12  Hyperesthesia -     Vitamin B12  Testicular pain  Burning with urination  Left ear pain  Screen for STD (sexually transmitted disease) -     RPR -     GC/Chlamydia Probe Amp -     HIV antibody  Follow-up pending labs, yearly for physical

## 2016-09-14 NOTE — Telephone Encounter (Signed)
Pt 's mother called said that he is having mood swing and might be going though a depression. She said that he has had very stressful year and she is very concerned for him.

## 2016-09-17 ENCOUNTER — Other Ambulatory Visit: Payer: Self-pay | Admitting: Medical

## 2016-09-17 ENCOUNTER — Encounter: Payer: Self-pay | Admitting: Medical

## 2016-09-17 DIAGNOSIS — E538 Deficiency of other specified B group vitamins: Secondary | ICD-10-CM

## 2016-09-17 DIAGNOSIS — R0781 Pleurodynia: Secondary | ICD-10-CM

## 2016-09-17 MED ORDER — VITAMIN B-12 1000 MCG PO TABS: 1000.0000 ug | ORAL_TABLET | Freq: Every day | ORAL | 1 refills | Status: DC

## 2016-09-18 LAB — CBC
HCT: 43.6 % (ref 38.5–50.0)
Hemoglobin: 14.9 g/dL (ref 13.2–17.1)
MCH: 29.2 pg (ref 27.0–33.0)
MCHC: 34.2 g/dL (ref 32.0–36.0)
MCV: 85.5 fL (ref 80.0–100.0)
MPV: 10.9 fL (ref 7.5–12.5)
PLATELETS: 323 10*3/uL (ref 140–400)
RBC: 5.1 10*6/uL (ref 4.20–5.80)
RDW: 12 % (ref 11.0–15.0)
WBC: 5.4 10*3/uL (ref 3.8–10.8)

## 2016-09-18 LAB — COMPREHENSIVE METABOLIC PANEL
AG RATIO: 1.8 (calc) (ref 1.0–2.5)
ALKALINE PHOSPHATASE (APISO): 52 U/L (ref 40–115)
ALT: 9 U/L (ref 9–46)
AST: 16 U/L (ref 10–40)
Albumin: 5 g/dL (ref 3.6–5.1)
BUN: 16 mg/dL (ref 7–25)
CO2: 25 mmol/L (ref 20–32)
Calcium: 10.1 mg/dL (ref 8.6–10.3)
Chloride: 101 mmol/L (ref 98–110)
Creat: 0.96 mg/dL (ref 0.60–1.35)
GLOBULIN: 2.8 g/dL (ref 1.9–3.7)
Glucose, Bld: 83 mg/dL (ref 65–99)
Potassium: 3.8 mmol/L (ref 3.5–5.3)
Sodium: 139 mmol/L (ref 135–146)
Total Bilirubin: 0.8 mg/dL (ref 0.2–1.2)
Total Protein: 7.8 g/dL (ref 6.1–8.1)

## 2016-09-18 LAB — TSH: TSH: 2.96 m[IU]/L (ref 0.40–4.50)

## 2016-09-18 LAB — TEST AUTHORIZATION

## 2016-09-18 LAB — LIPID PANEL
CHOLESTEROL: 168 mg/dL (ref <200)
HDL: 55 mg/dL (ref >40)
LDL Cholesterol (Calc): 98 mg/dL (calc)
Non-HDL Cholesterol (Calc): 113 mg/dL (calc) (ref <130)
Total CHOL/HDL Ratio: 3.1 (calc) (ref <5.0)
Triglycerides: 65 mg/dL (ref <150)

## 2016-09-18 LAB — HIV ANTIBODY (ROUTINE TESTING W REFLEX): HIV 1&2 Ab, 4th Generation: NONREACTIVE

## 2016-09-18 LAB — RPR: RPR: NONREACTIVE

## 2016-09-18 LAB — C. TRACHOMATIS/N. GONORRHOEAE RNA
C. TRACHOMATIS RNA, TMA: NOT DETECTED
N. gonorrhoeae RNA, TMA: NOT DETECTED

## 2016-09-18 LAB — FERRITIN: Ferritin: 71 ng/mL (ref 20–345)

## 2016-09-18 LAB — FOLATE: Folate: 17.2 ng/mL

## 2016-09-18 LAB — VITAMIN B12: Vitamin B-12: 149 pg/mL — ABNORMAL LOW (ref 200–1100)

## 2016-10-02 ENCOUNTER — Encounter: Payer: BLUE CROSS/BLUE SHIELD | Admitting: Medical

## 2016-10-22 ENCOUNTER — Ambulatory Visit: Payer: Self-pay | Admitting: Medical

## 2016-10-22 ENCOUNTER — Emergency Department (HOSPITAL_COMMUNITY): Payer: BLUE CROSS/BLUE SHIELD

## 2016-10-22 ENCOUNTER — Emergency Department (HOSPITAL_COMMUNITY)
Admission: EM | Admit: 2016-10-22 | Discharge: 2016-10-22 | Disposition: A | Discharge status: Home / Self Care | Payer: BLUE CROSS/BLUE SHIELD | Attending: Emergency Medicine | Admitting: Emergency Medicine

## 2016-10-22 ENCOUNTER — Encounter (HOSPITAL_COMMUNITY): Payer: Self-pay | Admitting: Nurse Practitioner

## 2016-10-22 DIAGNOSIS — R440 Auditory hallucinations: Secondary | ICD-10-CM | POA: Diagnosis not present

## 2016-10-22 DIAGNOSIS — Z87891 Personal history of nicotine dependence: Secondary | ICD-10-CM | POA: Diagnosis not present

## 2016-10-22 LAB — CBC WITH DIFFERENTIAL/PLATELET
BASOS ABS: 0 10*3/uL (ref 0.0–0.1)
BASOS PCT: 0 %
EOS PCT: 2 %
Eosinophils Absolute: 0.1 10*3/uL (ref 0.0–0.7)
HEMATOCRIT: 45.1 % (ref 39.0–52.0)
Hemoglobin: 15.6 g/dL (ref 13.0–17.0)
Lymphocytes Relative: 62 %
Lymphs Abs: 2.9 10*3/uL (ref 0.7–4.0)
MCH: 29.5 pg (ref 26.0–34.0)
MCHC: 34.6 g/dL (ref 30.0–36.0)
MCV: 85.4 fL (ref 78.0–100.0)
MONO ABS: 0.4 10*3/uL (ref 0.1–1.0)
Monocytes Relative: 8 %
Neutro Abs: 1.3 10*3/uL — ABNORMAL LOW (ref 1.7–7.7)
Neutrophils Relative %: 28 %
Platelets: 371 10*3/uL (ref 150–400)
RBC: 5.28 MIL/uL (ref 4.22–5.81)
RDW: 12 % (ref 11.5–15.5)
WBC: 4.7 10*3/uL (ref 4.0–10.5)

## 2016-10-22 LAB — COMPREHENSIVE METABOLIC PANEL
ALT: 12 U/L — AB (ref 17–63)
AST: 19 U/L (ref 15–41)
Albumin: 5.2 g/dL — ABNORMAL HIGH (ref 3.5–5.0)
Alkaline Phosphatase: 53 U/L (ref 38–126)
Anion gap: 9 (ref 5–15)
BUN: 11 mg/dL (ref 6–20)
CO2: 28 mmol/L (ref 22–32)
CREATININE: 0.96 mg/dL (ref 0.61–1.24)
Calcium: 9.7 mg/dL (ref 8.9–10.3)
Chloride: 103 mmol/L (ref 101–111)
GFR calc Af Amer: >60 mL/min (ref >60)
GFR calc non Af Amer: >60 mL/min (ref >60)
Glucose, Bld: 79 mg/dL (ref 65–99)
Potassium: 4 mmol/L (ref 3.5–5.1)
Sodium: 140 mmol/L (ref 135–145)
Total Bilirubin: 0.9 mg/dL (ref 0.3–1.2)
Total Protein: 8.1 g/dL (ref 6.5–8.1)

## 2016-10-22 LAB — RAPID URINE DRUG SCREEN, HOSP PERFORMED
AMPHETAMINES: NOT DETECTED
BARBITURATES: NOT DETECTED
Benzodiazepines: NOT DETECTED
COCAINE: NOT DETECTED
Opiates: NOT DETECTED
TETRAHYDROCANNABINOL: NOT DETECTED

## 2016-10-22 LAB — ETHANOL

## 2016-10-22 MED ORDER — ZOLPIDEM TARTRATE 5 MG PO TABS: 5.0000 mg | ORAL_TABLET | Freq: Every evening | ORAL | Status: DC | PRN

## 2016-10-22 MED ORDER — IBUPROFEN 200 MG PO TABS: 600.0000 mg | ORAL_TABLET | Freq: Three times a day (TID) | ORAL | Status: DC | PRN

## 2016-10-22 MED ORDER — ONDANSETRON HCL 4 MG PO TABS: 4.0000 mg | ORAL_TABLET | Freq: Three times a day (TID) | ORAL | Status: DC | PRN

## 2016-10-22 MED ORDER — ALUM & MAG HYDROXIDE-SIMETH 200-200-20 MG/5ML PO SUSP: 30.0000 mL | Freq: Four times a day (QID) | ORAL | Status: DC | PRN

## 2016-10-22 NOTE — BH Assessment (Addendum)
Assessment Note  [name] is an 26 y.o. male.  -Clinician reviewed note by Dr. Preston Fleeting.  He was brought in by his mother because of auditory hallucinations. He states he hears noises which get him angry and upset. He denies hearing voices. This has been happening for about the last 2 weeks. He denies crying spells, early morning wakening, and anhedonia. He denies feeling depressed. He denies visual hallucinations.he denies homicidal ideation and suicidal ideation. He does admit that he has been short tempered. Mother has noted that his temper has been short for about one month, but he has never been violent toward anybody. During this time, he is also complaining of an occipital headache which is worse if he touches any electronic device. 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 He does not have any history of prior psychiatric problems, but bipolar disorder runs on his mother's side of the family, including his mother. He denies alcohol or drug use.  Patient was brought to [facility] by his mother.  He denies hearing voices.  When asked why he is here he says he is having headaches.  He said that when he is around or touches electronics he has these headaches.  Patient said that he started having them a little more than a month ago.  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  He said he noticed it after he used a neck massager on his shoulders/neck area.    Patient says that he was aware of the microchip off and on during the year.  He says it causes these headaches when he handles electronics.  Patient says he does not drive places, his brother drives them (has an identical twin brother).  Patient works as a Copy at [employer].  And he says he does not handle electronics there.  Patient denies any HI, SI or visual hallucinations.  He has some unspecified delusional behavior.  Otherwise patient is pleasant  and mannerly, calm and cooperative.  Patient denies use of ETOH or illicit drugs to this clinician.  He has had no previous inpatient or outpatient psychiatric care.  -Clinician discussed patient care with Donell Sievert, PA who recommends outpatient referrals.  Patient does not present any lethality at this time.  Clinician discussed disposition with Dr. Preston Fleeting, who was in agreement with patient not needing inpatient care at this time.  Clinician did provide patient with outpatient resources.  Mother said she will follow up on them tomorrow.  Diagnosis: Schizoaffective d/o  Past Medical History:  Past Medical History:  Diagnosis Date  . Allergic rhinitis    mostly spring  . Strep throat 04/2013   obervation in ED due to high fever, illness    Past Surgical History:  Procedure Laterality Date  . ORCHIOPEXY     bilat, in childhood    Family History:  Family History  Problem Relation Age of Onset  . Other Mother        "tumor of stomach"  . Anemia Mother   . Other Father        unknown  . Diabetes Brother        borderline  . Hypertension Maternal Grandmother   . Cancer Neg Hx   . Stroke Neg Hx   . Heart disease Neg Hx   . Aneurysm Neg Hx     Social History:  reports that he has  quit smoking. He has never used smokeless tobacco. He reports that he uses drugs, including Marijuana. He reports that he does not drink alcohol.  Additional Social History:  Alcohol / Drug Use Pain Medications: None Prescriptions: None Over the Counter: None History of alcohol / drug use?: No history of alcohol / drug abuse  CIWA: CIWA-Ar BP: 133/73 Pulse Rate: 98 COWS:    Allergies: No Known Allergies  Home Medications:  (Not in a hospital admission)  OB/GYN Status:  No LMP for male patient.  General Assessment Data Location of Assessment: [facility] ED TTS Assessment: In system Is this a Tele or Face-to-Face Assessment?: Face-to-Face Is this an Initial Assessment or a Re-assessment for  this encounter?: Initial Assessment Marital status: Single Is patient pregnant?: No Pregnancy Status: No Living Arrangements: Parent (Lives with mother, his twin brother and mom's friend) Can pt return to current living arrangement?: Yes Admission Status: Voluntary Is patient capable of signing voluntary admission?: Yes Referral Source: Self/Family/Friend (Mother drove him to [facility] [facility].) Insurance type: Self pay     Crisis Care Plan Living Arrangements: Parent (Lives with mother, his twin brother and mom's friend) Name of Psychiatrist: None Name of Therapist: None  Education Status Is patient currently in school?: No Highest grade of school patient has completed: 11th grade.  Has GED  Risk to self with the past 6 months Suicidal Ideation: No Has patient been a risk to self within the past 6 months prior to admission? : No Suicidal Intent: No Has patient had any suicidal intent within the past 6 months prior to admission? : No Is patient at risk for suicide?: No Suicidal Plan?: No Has patient had any suicidal plan within the past 6 months prior to admission? : No Access to Means: No What has been your use of drugs/alcohol within the last 12 months?: None Previous Attempts/Gestures: No How many times?: 0 Other Self Harm Risks: None Triggers for Past Attempts: None known Intentional Self Injurious Behavior: None Family Suicide History: No Recent stressful life event(s): Other (Comment) (No stressors reported.) Persecutory voices/beliefs?: No Depression: No Depression Symptoms:  (Pt denies depressive symptoms.) Substance abuse history and/or treatment for substance abuse?: No Suicide prevention information given to non-admitted patients: Not applicable  Risk to Others within the past 6 months Homicidal Ideation: No Does patient have any lifetime risk of violence toward others beyond the six months prior to admission? : No Thoughts of Harm to Others: No Current Homicidal  Intent: No Current Homicidal Plan: No Access to Homicidal Means: No Identified Victim: No one History of harm to others?: No Assessment of Violence: None Noted Violent Behavior Description: None reported Does patient have access to weapons?: No Criminal Charges Pending?: No Does patient have a court date: No Is patient on probation?: No  Psychosis Hallucinations: Tactile (Can feel some pressure from microchip implant) Delusions: Unspecified (Pt believes he has a microchip implant in back of head.)  Mental Status Report Appearance/Hygiene: Unremarkable Eye Contact: Good Motor Activity: Freedom of movement, Unremarkable Speech: Logical/coherent, Soft Level of Consciousness: Alert Mood: Pleasant Affect: Appropriate to circumstance Anxiety Level: Minimal Thought Processes: Coherent Judgement: Impaired Orientation: Person, Place, Time, Situation Obsessive Compulsive Thoughts/Behaviors: None  Cognitive Functioning Concentration: Normal Memory: Remote Intact, Recent Intact IQ: Average Insight: Poor Impulse Control: Fair Appetite: Good Weight Loss: 0 Weight Gain: 0 Sleep: No Change Total Hours of Sleep: 8 Vegetative Symptoms: None  ADLScreening [facility] Assessment Services) Patient's cognitive ability adequate to safely complete daily activities?: Yes Patient able to express  need for assistance with ADLs?: Yes Independently performs ADLs?: Yes (appropriate for developmental age)  Prior Inpatient Therapy Prior Inpatient Therapy: No Prior Therapy Dates: None Prior Therapy Facilty/Provider(s): None Reason for Treatment: None  Prior Outpatient Therapy Prior Outpatient Therapy: No Prior Therapy Dates: None Prior Therapy Facilty/Provider(s): None Reason for Treatment: None Does patient have an ACCT team?: No Does patient have Intensive In-House Services?  : No Does patient have [facility] services? : No Does patient have P4CC services?: No  ADL Screening (condition at time  of admission) Patient's cognitive ability adequate to safely complete daily activities?: Yes Is the patient deaf or have difficulty hearing?: No Does the patient have difficulty seeing, even when wearing glasses/contacts?: No Does the patient have difficulty concentrating, remembering, or making decisions?: No Patient able to express need for assistance with ADLs?: Yes Does the patient have difficulty dressing or bathing?: No Independently performs ADLs?: Yes (appropriate for developmental age) Does the patient have difficulty walking or climbing stairs?: No Weakness of Legs: None Weakness of Arms/Hands: None       Abuse/Neglect Assessment (Assessment to be complete while patient is alone) Physical Abuse: Denies Verbal Abuse: Denies Sexual Abuse: Denies Exploitation of patient/patient's resources: Denies Self-Neglect: Denies     Merchant navy officer (For Healthcare) Does Patient Have a Medical Advance Directive?: No Would patient like information on creating a medical advance directive?: No - Patient declined    Additional Information 1:1 In Past 12 Months?: No CIRT Risk: No Elopement Risk: No Does patient have medical clearance?: Yes     Disposition:  Disposition Initial Assessment Completed for this Encounter: Yes Disposition of Patient: Other dispositions Other disposition(s): Other (Comment) (Pt to be reviewed with PA)  On Site Evaluation by:   Reviewed with Physician:    Alexandria Lodge 10/22/2016 8:50 PM

## 2016-10-22 NOTE — ED Triage Notes (Signed)
Patient brought in by mother. Patient is hearing voices.

## 2016-10-22 NOTE — Discharge Instructions (Signed)
Return if symptoms are worsening, or if you start having thoughts of hurting yourself.

## 2016-10-22 NOTE — ED Provider Notes (Signed)
[facility]-EMERGENCY DEPT Provider Note   CSN: [id number] Arrival date & time: 10/22/16  1736     History   Chief Complaint No chief complaint on file.   HPI [name] is a 26 y.o. male.  The history is provided by the patient and a parent.  He was brought in by his mother because of auditory hallucinations. He states he hears noises which get him angry and upset. He denies hearing voices. This has been happening for about the last 2 weeks. He denies crying spells, early morning wakening, and anhedonia. He denies feeling depressed. He denies visual hallucinations.he denies homicidal ideation and suicidal ideation. He does admit that he has been short tempered. Mother has noted that his temper has been short for about one month, but he has never been violent toward anybody. During this time, he is also complaining of an occipital headache which is worse if he touches any electronic device. 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 He does not have any history of prior psychiatric problems, but bipolar disorder runs on his mother's side of the family, including his mother. He denies alcohol or drug use.  Past Medical History:  Diagnosis Date  . Allergic rhinitis    mostly spring  . Strep throat 04/2013   obervation in ED due to high fever, illness    Patient Active Problem List   Diagnosis Date Noted  . Screen for STD (sexually transmitted disease) 09/14/2016  . Hyperesthesia 09/14/2016  . Testicular pain 09/14/2016  . Burning with urination 09/14/2016  . Left ear pain 09/14/2016  . Muscle strain 11/09/2015  . Strain of back 11/09/2015  . Back spasm 11/09/2015    Past Surgical History:  Procedure Laterality Date  . ORCHIOPEXY     bilat, in childhood       Home Medications    Prior to Admission medications   Medication Sig Start Date End Date Taking? Authorizing Provider  ketoconazole (NIZORAL)  2 % shampoo Apply 1 application topically 2 (two) times a week. Patient not taking: Reported on 10/22/2016 09/17/16   Tysinger, Kermit Balo, PA-C  vitamin B-12 (CYANOCOBALAMIN) 1000 MCG tablet Take 1 tablet (1,000 mcg total) by mouth daily. Patient not taking: Reported on 10/22/2016 09/17/16   Tysinger, Kermit Balo, PA-C    Family History Family History  Problem Relation Age of Onset  . Other Mother        "tumor of stomach"  . Anemia Mother   . Other Father        unknown  . Diabetes Brother        borderline  . Hypertension Maternal Grandmother   . Cancer Neg Hx   . Stroke Neg Hx   . Heart disease Neg Hx   . Aneurysm Neg Hx     Social History Social History  Substance Use Topics  . Smoking status: Former Games developer  . Smokeless tobacco: Never Used  . Alcohol use No     Allergies   Patient has no known allergies.   Review of Systems Review of Systems  All other systems reviewed and are negative.    Physical Exam Updated Vital Signs BP 133/73 (BP Location: Left Arm)   Pulse 98   Temp 98.8 F (37.1 C) (Oral)   Resp 16   Ht  (1.88 m)   Wt 81.6 kg (180 lb)   SpO2 100%   BMI 23.11 kg/m  Physical Exam  Nursing note and vitals reviewed.  26 year old male, resting comfortably and in no acute distress. Vital signs are normal. Oxygen saturation is 100%, which is normal. Head is normocephalic and atraumatic. PERRLA, EOMI. Oropharynx is clear. Neck is nontender and supple without adenopathy or JVD. Back is nontender and there is no CVA tenderness. Lungs are clear without rales, wheezes, or rhonchi. Chest is nontender. Heart has regular rate and rhythm without murmur. Abdomen is soft, flat, nontender without masses or hepatosplenomegaly and peristalsis is normoactive. Extremities have no cyanosis or edema, full range of motion is present. Skin is warm and dry without rash. Neurologic: Mental status is normal, cranial nerves are intact, there are no motor or sensory  deficits.  Psychiatric: He makes good eye contact and speaks with normal inflections. He does not appear to be responding to internal stimuli.  ED Treatments / Results  Labs (all labs ordered are listed, but only abnormal results are displayed) Labs Reviewed  COMPREHENSIVE METABOLIC PANEL - Abnormal; Notable for the following:       Result Value   Albumin 5.2 (*)    ALT 12 (*)    All other components within normal limits  CBC WITH DIFFERENTIAL/PLATELET - Abnormal; Notable for the following:    Neutro Abs 1.3 (*)    All other components within normal limits  ETHANOL  RAPID URINE DRUG SCREEN, HOSP PERFORMED    Radiology Ct Head Wo Contrast  Result Date: 10/22/2016 CLINICAL DATA:  26 y/o  M; left posterior headache. EXAM: CT HEAD WITHOUT CONTRAST TECHNIQUE: Contiguous axial images were obtained from the base of the skull through the vertex without intravenous contrast. COMPARISON:  None. FINDINGS: Brain: No evidence of acute infarction, hemorrhage, hydrocephalus, extra-axial collection or mass lesion/mass effect. Vascular: No hyperdense vessel or unexpected calcification. Skull: Normal. Negative for fracture or focal lesion. Sinuses/Orbits: Small left sphenoid sinus mucous retention cyst. Otherwise negative. Other: None. IMPRESSION: No acute intracranial abnormality.  Unremarkable CT of the head. Electronically Signed   By: Mitzi Hansen M.D.   On: 10/22/2016 18:55    Procedures Procedures (including critical care time)  Medications Ordered in ED Medications - No data to display   Initial Impression / Assessment and Plan / ED Course  I have reviewed the triage vital signs and the nursing notes.  Pertinent labs & imaging results that were available during my care of the patient were reviewed by me and considered in my medical decision making (see chart for details).  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 Old records are reviewed, and he has no relevant past visits. Will send for  CT of head to make sure there is no tumor to account for current symptoms. TTS consultation will be obtained.  CT is negative. TTS consultation is appreciated. Patient does not need inpatient care at this time, and is given referral to outpatient psychiatry resources. Return precautions discussed.  Final Clinical Impressions(s) / ED Diagnoses   Final diagnoses:  Auditory hallucinations    New Prescriptions New Prescriptions   No medications on file     Dione Booze, MD 10/22/16 2124

## 2016-10-22 NOTE — ED Notes (Signed)
Provider spoke to patient and mom , patient ready for discharge

## 2016-10-25 ENCOUNTER — Encounter: Payer: Self-pay | Admitting: Medical

## 2016-10-30 ENCOUNTER — Ambulatory Visit (HOSPITAL_COMMUNITY)
Admission: RE | Admit: 2016-10-30 | Discharge: 2016-10-30 | Disposition: A | Discharge status: Home / Self Care | Payer: BLUE CROSS/BLUE SHIELD | Attending: Psychiatry | Admitting: Psychiatry

## 2016-10-30 DIAGNOSIS — Z1339 Encounter for screening examination for other mental health and behavioral disorders: Secondary | ICD-10-CM | POA: Diagnosis not present

## 2016-10-30 NOTE — BH Assessment (Signed)
Assessment Note  [name] is an 26 y.o. male presenting with his mother to [facility]. The patient was assessed on 10/22/16 at [facility]. He expressed some delusions at that time but was not a risk to self or others. Appeared to have new onset of symptoms. The patient was given referrals and told to follow up with outpatient treatment. The patients symptoms worsened over the weekend, impacting his life. The patient continues to believe he has a microchip in his head. He reports one year ago he had neck pain, wore a neck brace for a period of time and from this experience a microchip was somehow implanted in the back of his head. States any time he touches electronics he gets a headache. Over the weekend he would not pick up his phone, touch the radio or the tv. He refused to go to work the last three days because he believes the tv's  at work will give him a headache. The patient is a Copy at [employer].   The patient reports he periodically hears a clicking noise when asked about auditory hallucinations but not today. The patient admits to prior cannabis use and paranoia when high but denies use in over one year. The patient denies SI and HI. Denies past abuse. This clinician spoke to the patient's mother, Ammar Moffatt. She states they attempted to follow up with outpatient referrals but when she described the patients symptoms she was told to bring him for an assessment. Mother reports the patient was "jumped" in prison. Report he hasn't been the same since. The patient was in prison for a crime he didn't commit because he would not expose the person who actually committed the crime, he spent 19 months in prison. Mother was tearful but supportive. The patient lives with her. The patient was pleasant, had good eye contact, freedom of movement, alert, poor judgement and poor insight.   Leighton Ruff NP recommended inpatient on a voluntary basis but the patient did not want to sign in. Dr. Lucianne Muss also  spoke with the patient and did not deem the patient committable. The patient was encouraged to follow up with his PCP for a referral for neurological work up, the patient only had a head CT in the ER. The patient was also encouraged to contact [facility] for a psychiatrist appt.    Diagnosis: r/o Schizophrenia  Past Medical History:  Past Medical History:  Diagnosis Date  . Allergic rhinitis    mostly spring  . Strep throat 04/2013   obervation in ED due to high fever, illness    Past Surgical History:  Procedure Laterality Date  . ORCHIOPEXY     bilat, in childhood    Family History:  Family History  Problem Relation Age of Onset  . Other Mother        "tumor of stomach"  . Anemia Mother   . Other Father        unknown  . Diabetes Brother        borderline  . Hypertension Maternal Grandmother   . Cancer Neg Hx   . Stroke Neg Hx   . Heart disease Neg Hx   . Aneurysm Neg Hx     Social History:  reports that he has quit smoking. He has never used smokeless tobacco. He reports that he uses drugs, including Marijuana. He reports that he does not drink alcohol.  Additional Social History:  Alcohol / Drug Use Pain Medications: see MAR Prescriptions: see MAR Over  the Counter: see MAR History of alcohol / drug use?: Yes Substance #1 Name of Substance 1: cannabis 1 - Age of First Use: UTA 1 - Amount (size/oz): 1-2 blunts 1 - Frequency: several times a week 1 - Duration: UTA 1 - Last Use / Amount: 1 yr ago  CIWA: CIWA-Ar BP: 128/87 Pulse Rate: 69 COWS:    Allergies: No Known Allergies  Home Medications:  (Not in a hospital admission)  OB/GYN Status:  No LMP for male patient.  General Assessment Data Location of Assessment: [facility] TTS Assessment: In system Is this a Tele or Face-to-Face Assessment?: Face-to-Face Is this an Initial Assessment or a Re-assessment for this encounter?: Initial Assessment Marital status: Single Is patient  pregnant?: No Pregnancy Status: No Living Arrangements: Parent (mother, and brother) Can pt return to current living arrangement?: Yes Admission Status: Voluntary Is patient capable of signing voluntary admission?: Yes Referral Source: Self/Family/Friend Insurance type: self pay  Medical Screening Exam [facility] Walk-in ONLY) Medical Exam completed: Yes  Crisis Care Plan Living Arrangements: Parent (mother, and brother) Name of Psychiatrist: n/a Name of Therapist: n/a   Education Status Is patient currently in school?: No Highest grade of school patient has completed: 11th grade, GED  Risk to self with the past 6 months Suicidal Ideation: No Has patient been a risk to self within the past 6 months prior to admission? : No Suicidal Intent: No Has patient had any suicidal intent within the past 6 months prior to admission? : No Is patient at risk for suicide?: No Suicidal Plan?: No Has patient had any suicidal plan within the past 6 months prior to admission? : No Access to Means: No What has been your use of drugs/alcohol within the last 12 months?: cannabis Previous Attempts/Gestures: No How many times?: 0 Other Self Harm Risks: none Triggers for Past Attempts: None known Intentional Self Injurious Behavior: None Family Suicide History: No Recent stressful life event(s): Other (Comment) Persecutory voices/beliefs?: No Depression: No Substance abuse history and/or treatment for substance abuse?: No Suicide prevention information given to non-admitted patients: Not applicable  Risk to Others within the past 6 months Homicidal Ideation: No Does patient have any lifetime risk of violence toward others beyond the six months prior to admission? : No Thoughts of Harm to Others: No Current Homicidal Intent: No Current Homicidal Plan: No Access to Homicidal Means: No Identified Victim: n/a History of harm to others?: No Assessment of Violence: None Noted Violent Behavior  Description: None Reported  Does patient have access to weapons?: No Criminal Charges Pending?: No Does patient have a court date: No Is patient on probation?: No  Psychosis Hallucinations: None noted Delusions: Somatic, Unspecified  Mental Status Report Appearance/Hygiene: Unremarkable Eye Contact: Good Motor Activity: Freedom of movement Speech: Logical/coherent Level of Consciousness: Alert Mood: Pleasant Affect: Appropriate to circumstance Anxiety Level: Minimal Thought Processes: Coherent Judgement: Impaired Orientation: Person, Place, Time, Situation Obsessive Compulsive Thoughts/Behaviors: None  Cognitive Functioning Concentration: Normal Memory: Recent Intact, Remote Intact IQ: Average Insight: Poor Impulse Control: Fair Appetite: Good Weight Loss: 0 Weight Gain: 0 Sleep: Decreased Vegetative Symptoms: None  ADLScreening [facility]) Patient's cognitive ability adequate to safely complete daily activities?: Yes Patient able to express need for assistance with ADLs?: Yes Independently performs ADLs?: Yes (appropriate for developmental age)  Prior Inpatient Therapy Prior Inpatient Therapy: No  Prior Outpatient Therapy Prior Outpatient Therapy: No Does patient have an ACCT team?: No Does patient have Intensive In-House Services?  : No Does  patient have [facility] services? : No Does patient have P4CC services?: No  ADL Screening (condition at time of admission) Patient's cognitive ability adequate to safely complete daily activities?: Yes Is the patient deaf or have difficulty hearing?: No Does the patient have difficulty seeing, even when wearing glasses/contacts?: No Does the patient have difficulty concentrating, remembering, or making decisions?: Yes Patient able to express need for assistance with ADLs?: Yes Does the patient have difficulty dressing or bathing?: No Independently performs ADLs?: Yes (appropriate for developmental age)        Abuse/Neglect Assessment (Assessment to be complete while patient is alone) Physical Abuse: Denies Verbal Abuse: Denies Sexual Abuse: Denies     Merchant navy officerAdvance Directives (For Healthcare) Does Patient Have a Medical Advance Directive?: No    Additional Information 1:1 In Past 12 Months?: No CIRT Risk: No Elopement Risk: No Does patient have medical clearance?: Yes     Disposition:  Disposition Initial Assessment Completed for this Encounter: Yes Disposition of Patient: Other dispositions Other disposition(s): Other (Comment)  On Site Evaluation by:   Reviewed with Physician: Leighton Ruffina Okonkwo, NP and Dr. Rozann LeschesKumar  Janus Vlcek H Dacey Milberger 10/30/2016 2:40 PM

## 2016-10-30 NOTE — H&P (Signed)
Behavioral Health Medical Screening Exam  [name] is an 26 y.o. male.  Total Time spent with patient: 30 minutes  Psychiatric Specialty Exam: Physical Exam  Vitals reviewed. Constitutional: He is oriented to person, place, and time. He appears well-developed and well-nourished.  HENT:  Head: Normocephalic and atraumatic.  Eyes: Pupils are equal, round, and reactive to light.  Neck: Normal range of motion.  Cardiovascular: Normal rate, regular rhythm and normal heart sounds.   Respiratory: Effort normal and breath sounds normal.  GI: Soft. Bowel sounds are normal.  Musculoskeletal: Normal range of motion.  Neurological: He is alert and oriented to person, place, and time.  Skin: Skin is warm and dry.    Review of Systems  Psychiatric/Behavioral: Negative for depression, substance abuse and suicidal ideas. The patient is nervous/anxious.   All other systems reviewed and are negative.   Blood pressure 128/87, pulse 69, temperature 98.2 F (36.8 C), temperature source Oral, resp. rate 16, SpO2 100 %.There is no height or weight on file to calculate BMI.  General Appearance: Casual  Eye Contact:  Fair  Speech:  Clear and Coherent and Normal Rate  Volume:  Normal  Mood:  Anxious  Affect:  Congruent  Thought Process:  Coherent and Goal Directed  Orientation:  Full (Time, Place, and Person)  Thought Content:  Paranoid Ideation  Suicidal Thoughts:  No  Homicidal Thoughts:  No  Memory:  Immediate;   Fair Recent;   Fair Remote;   Fair  Judgement:  Poor  Insight:  Present  Psychomotor Activity:  Normal  Concentration: Concentration: Good and Attention Span: Good  Recall:  Good  Fund of Knowledge:Good  Language: Good  Akathisia:  Negative  Handed:  Right  AIMS (if indicated):     Assets:  Communication Skills Desire for Improvement Financial Resources/Insurance Housing Leisure Time Physical Health Resilience Social Support  Sleep:        Musculoskeletal: Strength & Muscle Tone: within normal limits Gait & Station: normal Patient leans: N/A  Blood pressure 128/87, pulse 69, temperature 98.2 F (36.8 C), temperature source Oral, resp. rate 16, SpO2 100 %.  Recommendations:  Based on my evaluation, the patient does not appears to have an emergency medical condition.  Per Dr. Lucianne MussKumar, patient needs [facility] to [facility] for a complete work up for headaches. [facility] also [facility].  Gene PereyraJustina A Christoph Copelan, NP 10/30/2016, 2:34 PM

## 2017-01-16 ENCOUNTER — Emergency Department (HOSPITAL_COMMUNITY)
Admission: EM | Admit: 2017-01-16 | Discharge: 2017-01-16 | Disposition: A | Discharge status: Other Acute Inpatient Hospital | Payer: BLUE CROSS/BLUE SHIELD | Attending: Emergency Medicine | Admitting: Emergency Medicine

## 2017-01-16 ENCOUNTER — Encounter (HOSPITAL_COMMUNITY): Payer: Self-pay

## 2017-01-16 ENCOUNTER — Other Ambulatory Visit: Payer: Self-pay

## 2017-01-16 ENCOUNTER — Inpatient Hospital Stay (HOSPITAL_COMMUNITY)
Admission: AD | Admit: 2017-01-16 | Discharge: 2017-01-22 | DRG: 885 | Disposition: A | Discharge status: Home / Self Care | Payer: BLUE CROSS/BLUE SHIELD | Attending: Psychiatry | Admitting: Psychiatry

## 2017-01-16 DIAGNOSIS — R4589 Other symptoms and signs involving emotional state: Secondary | ICD-10-CM | POA: Insufficient documentation

## 2017-01-16 DIAGNOSIS — G4700 Insomnia, unspecified: Secondary | ICD-10-CM | POA: Diagnosis present

## 2017-01-16 DIAGNOSIS — F250 Schizoaffective disorder, bipolar type: Secondary | ICD-10-CM | POA: Diagnosis not present

## 2017-01-16 DIAGNOSIS — J302 Other seasonal allergic rhinitis: Secondary | ICD-10-CM | POA: Diagnosis present

## 2017-01-16 DIAGNOSIS — R443 Hallucinations, unspecified: Secondary | ICD-10-CM

## 2017-01-16 DIAGNOSIS — R442 Other hallucinations: Secondary | ICD-10-CM | POA: Insufficient documentation

## 2017-01-16 DIAGNOSIS — Z87891 Personal history of nicotine dependence: Secondary | ICD-10-CM | POA: Insufficient documentation

## 2017-01-16 DIAGNOSIS — F333 Major depressive disorder, recurrent, severe with psychotic symptoms: Secondary | ICD-10-CM | POA: Insufficient documentation

## 2017-01-16 DIAGNOSIS — F419 Anxiety disorder, unspecified: Secondary | ICD-10-CM | POA: Diagnosis present

## 2017-01-16 DIAGNOSIS — R4689 Other symptoms and signs involving appearance and behavior: Secondary | ICD-10-CM

## 2017-01-16 DIAGNOSIS — F29 Unspecified psychosis not due to a substance or known physiological condition: Principal | ICD-10-CM | POA: Diagnosis present

## 2017-01-16 DIAGNOSIS — Z79899 Other long term (current) drug therapy: Secondary | ICD-10-CM

## 2017-01-16 DIAGNOSIS — Z046 Encounter for general psychiatric examination, requested by authority: Secondary | ICD-10-CM | POA: Diagnosis present

## 2017-01-16 LAB — COMPREHENSIVE METABOLIC PANEL
ALBUMIN: 5.2 g/dL — AB (ref 3.5–5.0)
ALK PHOS: 54 U/L (ref 38–126)
ALT: 19 U/L (ref 17–63)
ANION GAP: 9 (ref 5–15)
AST: 34 U/L (ref 15–41)
BUN: 15 mg/dL (ref 6–20)
CALCIUM: 10 mg/dL (ref 8.9–10.3)
CHLORIDE: 106 mmol/L (ref 101–111)
CO2: 23 mmol/L (ref 22–32)
Creatinine, Ser: 1.09 mg/dL (ref 0.61–1.24)
GFR calc non Af Amer: >60 mL/min (ref >60)
GLUCOSE: 108 mg/dL — AB (ref 65–99)
Potassium: 3.2 mmol/L — ABNORMAL LOW (ref 3.5–5.1)
SODIUM: 138 mmol/L (ref 135–145)
Total Bilirubin: 1.4 mg/dL — ABNORMAL HIGH (ref 0.3–1.2)
Total Protein: 8.4 g/dL — ABNORMAL HIGH (ref 6.5–8.1)

## 2017-01-16 LAB — RAPID URINE DRUG SCREEN, HOSP PERFORMED
AMPHETAMINES: NOT DETECTED
BARBITURATES: NOT DETECTED
BENZODIAZEPINES: NOT DETECTED
COCAINE: NOT DETECTED
OPIATES: NOT DETECTED
TETRAHYDROCANNABINOL: NOT DETECTED

## 2017-01-16 LAB — CBC
HEMATOCRIT: 43.1 % (ref 39.0–52.0)
HEMOGLOBIN: 15.1 g/dL (ref 13.0–17.0)
MCH: 30.1 pg (ref 26.0–34.0)
MCHC: 35 g/dL (ref 30.0–36.0)
MCV: 85.9 fL (ref 78.0–100.0)
Platelets: 356 10*3/uL (ref 150–400)
RBC: 5.02 MIL/uL (ref 4.22–5.81)
RDW: 12.1 % (ref 11.5–15.5)
WBC: 6.2 10*3/uL (ref 4.0–10.5)

## 2017-01-16 LAB — ACETAMINOPHEN LEVEL

## 2017-01-16 LAB — ETHANOL: Alcohol, Ethyl (B): <10 mg/dL (ref <10)

## 2017-01-16 LAB — SALICYLATE LEVEL

## 2017-01-16 MED ORDER — STERILE WATER FOR INJECTION IJ SOLN
INTRAMUSCULAR | Status: AC
  Filled 2017-01-16: qty 10

## 2017-01-16 MED ORDER — MAGNESIUM HYDROXIDE 400 MG/5ML PO SUSP: 30.0000 mL | Freq: Every day | ORAL | Status: DC | PRN

## 2017-01-16 MED ORDER — LORAZEPAM 1 MG PO TABS
1.0000 mg | ORAL_TABLET | Freq: Once | ORAL | Status: DC
  Administered 2017-01-16: 1 mg via ORAL
  Filled 2017-01-16: qty 1

## 2017-01-16 MED ORDER — LORAZEPAM 2 MG/ML IJ SOLN
1.0000 mg | Freq: Once | INTRAMUSCULAR | Status: AC
  Administered 2017-01-16: 1 mg via INTRAMUSCULAR
  Filled 2017-01-16: qty 1

## 2017-01-16 MED ORDER — ACETAMINOPHEN 325 MG PO TABS: 650.0000 mg | ORAL_TABLET | Freq: Four times a day (QID) | ORAL | Status: DC | PRN

## 2017-01-16 MED ORDER — DIPHENHYDRAMINE HCL 50 MG/ML IJ SOLN
50.0000 mg | Freq: Once | INTRAMUSCULAR | Status: AC
  Administered 2017-01-16: 50 mg via INTRAMUSCULAR
  Filled 2017-01-16: qty 1

## 2017-01-16 MED ORDER — RISPERIDONE 0.5 MG PO TABS: 0.5000 mg | ORAL_TABLET | Freq: Two times a day (BID) | ORAL | Status: DC

## 2017-01-16 MED ORDER — RISPERIDONE 0.5 MG PO TABS
0.5000 mg | ORAL_TABLET | Freq: Two times a day (BID) | ORAL | Status: DC
  Administered 2017-01-16 – 2017-01-17 (×2): 0.5 mg via ORAL
  Filled 2017-01-16 (×8): qty 1

## 2017-01-16 MED ORDER — ALUM & MAG HYDROXIDE-SIMETH 200-200-20 MG/5ML PO SUSP: 30.0000 mL | ORAL | Status: DC | PRN

## 2017-01-16 MED ORDER — TRAZODONE HCL 50 MG PO TABS
50.0000 mg | ORAL_TABLET | Freq: Every evening | ORAL | Status: DC | PRN
  Filled 2017-01-16: qty 1

## 2017-01-16 MED ORDER — ZIPRASIDONE MESYLATE 20 MG IM SOLR
20.0000 mg | Freq: Once | INTRAMUSCULAR | Status: AC
  Administered 2017-01-16: 20 mg via INTRAMUSCULAR
  Filled 2017-01-16: qty 20

## 2017-01-16 MED ORDER — HYDROXYZINE HCL 25 MG PO TABS: 25.0000 mg | ORAL_TABLET | Freq: Three times a day (TID) | ORAL | Status: DC | PRN

## 2017-01-16 NOTE — ED Notes (Signed)
Pt continues to sit up in chair.  Pt assessed by TTS.

## 2017-01-16 NOTE — ED Notes (Signed)
Patient refused po Ativan-Shari EDPA made aware and will administer Ativan IM

## 2017-01-16 NOTE — Progress Notes (Signed)
Gene PaganiniRashawn is a 27 year old male being admitted involuntarily to [id number] from [facility].  [facility] after his boss had to evacuate his place of work because of yelling and aggressiveness.  [facility], he was guarded.  Minimal with interaction.  He stated that he was tired and wanted to lay down.  He was given PRN's for agitation in the ED prior to coming [facility].  He denied any SI/HI or A/V hallucinations.  Oriented him to the unit.  Admission paperwork completed and signed.  Belongings searched and secured in locker # 46-no contraband found.  Skin assessment completed and no skin issues noted.  Q 15 minute checks initiated for safety.  We will monitor the progress towards his goals.

## 2017-01-16 NOTE — ED Notes (Signed)
Pt awake, alert & responsive, no distress noted, calm at present.  Sleeping at present.  Monitoring for safety, Q 15 min checks in effect. 

## 2017-01-16 NOTE — ED Notes (Signed)
Patient compliant with wanding-ambulatory to Acute Care psych with [employer] and security and sitter-all belongings with sitter

## 2017-01-16 NOTE — ED Notes (Signed)
[facility] to [facility] [employer] [facility], [facility], [phone].  Pending [employer] transport in 45 min.

## 2017-01-16 NOTE — ED Triage Notes (Signed)
Patient arrives with [employer] in forensic restraints. Patient was at work/[employer]-patient started screaming and yelling at fellow co-workers. [employer] states they were called and they placed him in forensic restraints and brought patient here for medical clearance.

## 2017-01-16 NOTE — ED Notes (Signed)
Patient had forensic restraints removed-in to bathroom to change in to scrubs. Patient laughing to himself. Evaluated by S. Upstill EDPA. Patient remains tachycardic.

## 2017-01-16 NOTE — ED Notes (Signed)
Pt under IVC.  Per IVC papers, pt had an episode at work where he began screaming for no apparent reason.  Pts employer evacuated the building.  Pt also having outbursts at home and throwing things at the walls.  Pt reports he is hearing loud noises.  Awake, alert & responsive, no distress noted.  Pt given Ativan IM in main ED.  Sitting up in chair at present.  Monitoring for safety, Q 15 min checks in effect.

## 2017-01-16 NOTE — [facility] Counselor (Signed)
Per Lisa/Terri RN, Pt is not medically cleared yet and there is not a private room available to complete Pt's assessment. TTS counselor will check back in a few hours on Pt's status.  Annamaria BootsValarie Sianne Tejada, MS, [facility] Therapeutic Triage Specialist

## 2017-01-16 NOTE — ED Notes (Signed)
Report to FijiLatrisha RN-patient to go to [phone]

## 2017-01-16 NOTE — BH Assessment (Addendum)
Assessment Note  [name] is an 27 y.o. male who [facility] in restraints displaying symptoms of aggression and combativeness.  Pt was IVC'd by his mother. Pt has was last assessed on 10/22/2016 and was recommended for outpatient therapy and given referrals.  Pt reports that he isn't seeing anyone for OPT. Pt denies being on any medication.  Pt denies current suicidal ideation with plans of. Pt acknowledges symptoms including: some sadness and isolation. PT denies homicidal ideation and history of violence. Pt denies auditory or visual hallucinations or other psychotic symptoms. Pt denies any current stressors include. TTS counselor spoke with Pt's mother who restated the information provided in the IVC document.  Mom reports the Pt can return home.  Pt lives with is mother, and states she is a support to him. Pt denies history of abuse and trauma. Pt denies there any family history of mental health and substance abuse. 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 Pt has fair insight and judgment. Pt's memory is intact.  Pt denies any legal history.  Pt denies OP history/IP history includes. Pt denied alcohol/ substance abuse.  Pt is dressed in scrubs, alert, oriented x4 with normal speech and normal motor behavior. Eye contact is poor. Pt's mood is apathetic and affect is flat. Affect is congruent with mood. Thought process is coherent and relevant. There is no indication Pt is currently responding to internal stimuli or experiencing delusional thought content. Pt was cooperative throughout assessment.    Diagnosis: F25.0 Schizoaffective disorder, Bipolar type, by history   Past Medical History:  Past Medical History:  Diagnosis Date  . Allergic rhinitis    mostly spring  . Strep throat 04/2013   obervation in ED due to high fever, illness    Past Surgical History:  Procedure Laterality Date  . ORCHIOPEXY     bilat, in  childhood    Family History:  Family History  Problem Relation Age of Onset  . Other Mother        "tumor of stomach"  . Anemia Mother   . Other Father        unknown  . Diabetes Brother        borderline  . Hypertension Maternal Grandmother   . Cancer Neg Hx   . Stroke Neg Hx   . Heart disease Neg Hx   . Aneurysm Neg Hx     Social History:  reports that he has quit smoking. he has never used smokeless tobacco. He reports that he uses drugs. Drug: Marijuana. He reports that he does not drink alcohol.  Additional Social History:  Alcohol / Drug Use Pain Medications: See MAR Prescriptions: See MAR Over the Counter: See MAR History of alcohol / drug use?: No history of alcohol / drug abuse  CIWA: CIWA-Ar BP: (!) 153/92 Pulse Rate: (!) 106 COWS:    Allergies: No Known Allergies  Home Medications:  (Not in a hospital admission)  OB/GYN Status:  No LMP for male patient.  General Assessment Data Assessment unable to be completed: Yes Reason for not completing assessment: Per Lisa/Terri RN, Pt is not medically cleared yet and there is not a private room available to complete Pt's assessment. TTS counselor will check back in a few hours on Pt's status. Location of Assessment: [facility] TTS Assessment: In system Is this a Tele or Face-to-Face Assessment?: Face-to-Face Is this an Initial Assessment or a Re-assessment for this encounter?: Initial Assessment Marital status:  Single Maiden name: N/A Is patient pregnant?: No Pregnancy Status: No Living Arrangements: Parent Can pt return to current living arrangement?: Yes Admission Status: Involuntary Is patient capable of signing voluntary admission?: No Referral Source: Other([employer]) Insurance type: BCBS     Crisis Care Plan Living Arrangements: Parent  Education Status Highest grade of school patient has completed: GED  Risk to self with the past 6 months Suicidal Ideation: No Has patient been a risk to self within the  past 6 months prior to admission? : No Suicidal Intent: No Has patient had any suicidal intent within the past 6 months prior to admission? : No Is patient at risk for suicide?: No Suicidal Plan?: No Has patient had any suicidal plan within the past 6 months prior to admission? : No Access to Means: No What has been your use of drugs/alcohol within the last 12 months?: Pt states he has never used drugs Previous Attempts/Gestures: Yes How many times?: 0 Other Self Harm Risks: None reported Triggers for Past Attempts: None known, Other (Comment)(N/A) Intentional Self Injurious Behavior: None Family Suicide History: No Recent stressful life event(s): Other (Comment)(Pt stated there are no stressors) Persecutory voices/beliefs?: Yes Depression: No Depression Symptoms: Isolating(Pt reports some sadness) Substance abuse history and/or treatment for substance abuse?: Yes Suicide prevention information given to non-admitted patients: Not applicable  Risk to Others within the past 6 months Homicidal Ideation: No Does patient have any lifetime risk of violence toward others beyond the six months prior to admission? : No Thoughts of Harm to Others: No Current Homicidal Intent: No Current Homicidal Plan: No Access to Homicidal Means: No Identified Victim: None reported History of harm to others?: No Assessment of Violence: None Noted Violent Behavior Description: None reported Does patient have access to weapons?: No Criminal Charges Pending?: No Does patient have a court date: No Is patient on probation?: No  Psychosis Hallucinations: None noted Delusions: None noted  Mental Status Report Appearance/Hygiene: In scrubs Eye Contact: Poor Motor Activity: Freedom of movement Speech: Logical/coherent Level of Consciousness: Alert Mood: Apathetic Affect: Flat Anxiety Level: None Thought Processes: Coherent, Relevant Judgement: Unimpaired Orientation: Person, Place, Time, Situation,  Appropriate for developmental age Obsessive Compulsive Thoughts/Behaviors: None  Cognitive Functioning Concentration: Normal Memory: Recent Intact, Remote Intact IQ: Average Insight: Fair Impulse Control: Fair Appetite: Good Weight Loss: 0 Weight Gain: 0 Sleep: No Change Total Hours of Sleep: 7 Vegetative Symptoms: None  ADLScreening [facility] Assessment Services) Patient's cognitive ability adequate to safely complete daily activities?: Yes Patient able to express need for assistance with ADLs?: Yes Independently performs ADLs?: Yes (appropriate for developmental age)  Prior Inpatient Therapy Prior Inpatient Therapy: No Prior Therapy Dates: N/A Prior Therapy Facilty/Provider(s): N/A Reason for Treatment: N/A  Prior Outpatient Therapy Prior Outpatient Therapy: No Prior Therapy Dates: N/A Prior Therapy Facilty/Provider(s): N/A Reason for Treatment: N/A Does patient have an ACCT team?: No Does patient have Intensive In-House Services?  : No Does patient have [facility] services? : No Does patient have P4CC services?: No  ADL Screening (condition at time of admission) Patient's cognitive ability adequate to safely complete daily activities?: Yes Is the patient deaf or have difficulty hearing?: No Does the patient have difficulty seeing, even when wearing glasses/contacts?: No Does the patient have difficulty concentrating, remembering, or making decisions?: No Patient able to express need for assistance with ADLs?: Yes Does the patient have difficulty dressing or bathing?: No Independently performs ADLs?: Yes (appropriate for developmental age) Does the patient have difficulty walking or climbing stairs?:  No Weakness of Legs: None Weakness of Arms/Hands: None  Home Assistive Devices/Equipment Home Assistive Devices/Equipment: None          Advance Directives (For Healthcare) Does Patient Have a Medical Advance Directive?: No    Additional Information 1:1 In Past 12  Months?: No CIRT Risk: No Elopement Risk: No Does patient have medical clearance?: Yes     Disposition: Gave clinical report to Donell Sievert, NP, who recommended psych evaluation in the morning.  Notified Hansel Starling, RN and Elpidio Anis, [place].  Disposition Initial Assessment Completed for this Encounter: Yes Disposition of Patient: Pending Review with psychiatrist(Per NP, psych eval in the morning)  On Site Evaluation by:   Reviewed with Physician:    Annamaria Boots, MS, [facility] Therapeutic Triage Specialist   Annamaria Boots 01/16/2017 6:23 AM

## 2017-01-16 NOTE — Tx Team (Signed)
Initial Treatment Plan 01/16/2017 11:01 PM [name] [id number]    PATIENT STRESSORS: Occupational concerns   PATIENT STRENGTHS: General fund of knowledge Physical Health Supportive family/friends   PATIENT IDENTIFIED PROBLEMS: Agtation/Aggression  "I would like to have enough time to calm down"  "Be able to leave on my own"                 DISCHARGE CRITERIA:  Improved stabilization in mood, thinking, and/or behavior Verbal commitment to aftercare and medication compliance  PRELIMINARY DISCHARGE PLAN: Outpatient therapy Medication management  PATIENT/FAMILY INVOLVEMENT: This treatment plan has been presented to and reviewed with the patient, [name].  The patient and family have been given the opportunity to ask questions and make suggestions.  Levin BaconHeather V Druanne Bosques, RN 01/16/2017, 11:01 PM

## 2017-01-16 NOTE — ED Notes (Signed)
Sitter at bedside.

## 2017-01-16 NOTE — ED Provider Notes (Signed)
[facility]-EMERGENCY DEPT Provider Note   CSN: [id number] Arrival date & time: 01/16/17  0002     History   Chief Complaint Chief Complaint  Patient presents with  . IVC  . Aggressive Behavior    HPI [name] is a 27 y.o. male.  The patient arrives via [employer] in handcuffs due to aggressive, combative behavior. He apparently was at work today when he started to become agitated and hallucinating, per co-worker who became frightened of the patient and call [employer]. The patient currently denies hallucinations, SI/HI. Per record review, he had previous visit for psychosis and patient reports no outpatient follow up since that time. He denies physical complaints. He denies drug or alcohol use.    The history is provided by the patient and the police. No language interpreter was used.    Past Medical History:  Diagnosis Date  . Allergic rhinitis    mostly spring  . Strep throat 04/2013   obervation in ED due to high fever, illness    Patient Active Problem List   Diagnosis Date Noted  . Screen for STD (sexually transmitted disease) 09/14/2016  . Hyperesthesia 09/14/2016  . Testicular pain 09/14/2016  . Burning with urination 09/14/2016  . Left ear pain 09/14/2016  . Muscle strain 11/09/2015  . Strain of back 11/09/2015  . Back spasm 11/09/2015    Past Surgical History:  Procedure Laterality Date  . ORCHIOPEXY     bilat, in childhood       Home Medications    Prior to Admission medications   Not on File    Family History Family History  Problem Relation Age of Onset  . Other Mother        "tumor of stomach"  . Anemia Mother   . Other Father        unknown  . Diabetes Brother        borderline  . Hypertension Maternal Grandmother   . Cancer Neg Hx   . Stroke Neg Hx   . Heart disease Neg Hx   . Aneurysm Neg Hx     Social History Social History   Tobacco Use  . Smoking status: Former Games developer  . Smokeless tobacco: Never Used    Substance Use Topics  . Alcohol use: No  . Drug use: Yes    Types: Marijuana     Allergies   Patient has no known allergies.   Review of Systems Review of Systems  Constitutional: Negative for chills and fever.  HENT: Negative.   Respiratory: Negative.   Cardiovascular: Negative.   Gastrointestinal: Negative.   Musculoskeletal: Negative.   Skin: Negative.   Neurological: Negative.   Psychiatric/Behavioral:       Patient denies.      Physical Exam Updated Vital Signs BP (!) 147/104 (BP Location: Right Arm)   Pulse (!) 126   Temp 99.4 F (37.4 C) (Oral)   Resp (!) 21   SpO2 98%   Physical Exam  Constitutional: He appears well-developed and well-nourished.  HENT:  Head: Normocephalic.  Neck: Normal range of motion. Neck supple.  Cardiovascular: Normal rate and regular rhythm.  Pulmonary/Chest: Effort normal and breath sounds normal.  Abdominal: Soft. Bowel sounds are normal. There is no tenderness. There is no rebound and no guarding.  Musculoskeletal: Normal range of motion.  Neurological: He is alert.  Skin: Skin is warm and dry. No rash noted.  Psychiatric: His affect is inappropriate. He is noncommunicative.  The patient answers  in yes and no only. He breaks out in laughter intermittently and at inappropriate times. Currently cooperative.      ED Treatments / Results  Labs (all labs ordered are listed, but only abnormal results are displayed) Labs Reviewed  COMPREHENSIVE METABOLIC PANEL - Abnormal; Notable for the following components:      Result Value   Potassium 3.2 (*)    Glucose, Bld 108 (*)    Total Protein 8.4 (*)    Albumin 5.2 (*)    Total Bilirubin 1.4 (*)    All other components within normal limits  ACETAMINOPHEN LEVEL - Abnormal; Notable for the following components:   Acetaminophen (Tylenol), Serum <10 (*)    All other components within normal limits  ETHANOL  SALICYLATE LEVEL  CBC  RAPID URINE DRUG SCREEN, HOSP PERFORMED     EKG  EKG Interpretation None       Radiology No results found.  Procedures Procedures (including critical care time)  Medications Ordered in ED Medications  LORazepam (ATIVAN) tablet 1 mg (not administered)     Initial Impression / Assessment and Plan / ED Course  I have reviewed the triage vital signs and the nursing notes.  Pertinent labs & imaging results that were available during my care of the patient were reviewed by me and considered in my medical decision making (see chart for details).     Patient with a history of psychosis presents under IVC petition in [employer] custody and hand cuffed for safety. Cuffs removed at this time.   TTS consultation requested to determine disposition. Given patient's aggressiveness today and psychiatric history, will place in SAPU.   He is found to be tachycardic, improving over time. Suspect this is related to his mental state. No evidence dehydration. Negative drug screen. He is considered medically cleared.   Final Clinical Impressions(s) / ED Diagnoses   Final diagnoses:  None   1. Hallucinating 2. Aggressive behavior   ED Discharge Orders    None       [name], Gene Ellingsen, PA-C 01/16/17 0148    Geoffery Lyonselo, [place], MD 01/16/17 [phone]

## 2017-01-16 NOTE — ED Notes (Signed)
Patient also declined offer of water. Administered Ativan 1 mg right deltoid

## 2017-01-16 NOTE — ED Notes (Signed)
[facility] Expected date:  Expected time:  Means of arrival:  Comments: [employer] Combative patient

## 2017-01-16 NOTE — ED Notes (Signed)
Patient states he was hearing voices at work [employer] manag[employer]. Patient states he never followed up with anyone since he was here in October. Patient will not make eye contact, speech is pressured. Patient remains in forensic restraints for patient and staff safety.

## 2017-01-16 NOTE — BH Assessment (Signed)
[facility]  Per Juanetta BeetsJacqueline Norman, DO, this pt requires psychiatric hospitalization.  Malva LimesLinsey Strader, RN, [id number]; [facility].  Pt presents under IVC initiated by pt's mother, and upheld by Dr Sharma CovertNorman, and [facility].  Pt's nurse, Kendal Hymendie, has been notified, and agrees to call report to [phone].  Pt is to be transported via [employer] examinerlaw enforcement.   Doylene Canninghomas Reygan Heagle, [place] Behavioral Health Coordinator [phone]

## 2017-01-16 NOTE — ED Notes (Signed)
Pt woke up agitated.  He continuously says, "Im ok,,Im ok.  I want to sign myself out."  He is not understanding that he is under IVC.  He appears confused.

## 2017-01-16 NOTE — ED Notes (Signed)
Pt is resting quietly.  Pt allowed us to give him his injections without difficulty.  Pt is in no distress.  Will continue to monitor.

## 2017-01-17 DIAGNOSIS — Z5689 Other problems related to employment: Secondary | ICD-10-CM

## 2017-01-17 DIAGNOSIS — F419 Anxiety disorder, unspecified: Secondary | ICD-10-CM

## 2017-01-17 DIAGNOSIS — F29 Unspecified psychosis not due to a substance or known physiological condition: Principal | ICD-10-CM

## 2017-01-17 DIAGNOSIS — Z818 Family history of other mental and behavioral disorders: Secondary | ICD-10-CM

## 2017-01-17 MED ORDER — RISPERIDONE 1 MG PO TABS
1.0000 mg | ORAL_TABLET | Freq: Every day | ORAL | Status: DC
  Administered 2017-01-17: 1 mg via ORAL
  Filled 2017-01-17 (×2): qty 1

## 2017-01-17 NOTE — BHH Group Notes (Signed)
Adult Psychoeducational Group Note  Date:  01/17/2017 Time:  9:18 PM  Group Topic/Focus:  Wrap-Up Group:   The focus of this group is to help patients review their daily goal of treatment and discuss progress on daily workbooks.  Participation Level:  Active  Participation Quality:  Appropriate and Attentive  Affect:  Appropriate  Cognitive:  Alert and Appropriate  Insight: Appropriate and Good  Engagement in Group:  Engaged  Modes of Intervention:  Discussion  Additional Comments:  Pt attended and participated in wrap up group. Pt rated their day a 5 because they remained calm and relaxed. Pt goal was to be more active, and they were able to go to the gym. A positive noted by the pt was that they were able to rest after lunch.   Gene NettersOctavia A Laurrie Haynes 01/17/2017, 9:18 PM
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
## 2017-01-17 NOTE — [facility] Suicide Risk Assessment (Signed)
[facility] INPATIENT:  Family/Significant Other Suicide Prevention Education  Suicide Prevention Education:  Education Completed; No one has been identified by the patient as the family member/significant other with whom the patient will be residing, and identified as the person(s) who will aid the patient in the event of a mental health crisis (suicidal ideations/suicide attempt).  With written consent from the patient, the family member/significant other has been provided the following suicide prevention education, prior to the and/or following the discharge of the patient.  The suicide prevention education provided includes the following:  Suicide risk factors  Suicide prevention and interventions  National Suicide Hotline telephone number  [facility] assessment telephone number  [facility] Emergency Assistance 911  [facility] and/or [facility]idential [facility] Crisis [facility] telephone number  Request made of family/significant other to:  Remove weapons (e.g., guns, rifles, knives), all items previously/currently identified as safety concern.    Remove drugs/medications (over-the-counter, prescriptions, illicit drugs), all items previously/currently identified as a safety concern.  The family member/significant other verbalizes understanding of the suicide prevention education information provided.  The family member/significant other agrees to remove the items of safety concern listed above. The patient did not endorse SI at the time of admission, nor did the patient c/o SI during the stay here.  SPE not required. However, I did talk to [name]. [phone], and we went over crises plan and treatment team recommendations.  [name] 01/17/2017, 11:35 AM

## 2017-01-17 NOTE — Progress Notes (Signed)
Pt in room staring at the door.  Pt encouraged to ask any questions. Pt unable to articulate the reason for this admission.   Pt denies pain or discomfort.  Pt denies SI, HI or AVH.  Pt verbally contracts for safety.   Pt has noticeable body odor.  Pt encouraged to attend group. Pt attends group and participates.   Pt remains safe on unit.

## 2017-01-17 NOTE — Tx Team (Signed)
Interdisciplinary Treatment and Diagnostic Plan Update  01/17/2017 Time of Session: 8:18 AM  [name] MRN: [id number]  Principal Diagnosis: <principal problem not specified>  Secondary Diagnoses: Active Problems:   MDD (major depressive disorder), recurrent, severe, with psychosis ([place])   Current Medications:  Current Facility-Administered Medications  Medication Dose Route Frequency Provider Last Rate Last Dose  . acetaminophen (TYLENOL) tablet 650 mg  650 mg Oral Q6H PRN Ethelene Hal, NP      . alum & mag hydroxide-simeth (MAALOX/MYLANTA) 200-200-20 MG/5ML suspension 30 mL  30 mL Oral Q4H PRN Ethelene Hal, NP      . hydrOXYzine (ATARAX/VISTARIL) tablet 25 mg  25 mg Oral TID PRN Ethelene Hal, NP      . magnesium hydroxide (MILK OF MAGNESIA) suspension 30 mL  30 mL Oral Daily PRN Ethelene Hal, NP      . risperiDONE (RISPERDAL) tablet 0.5 mg  0.5 mg Oral BID Ethelene Hal, NP   0.5 mg at 01/17/17 0744  . traZODone (DESYREL) tablet 50 mg  50 mg Oral QHS PRN Ethelene Hal, NP        PTA Medications: No medications prior to admission.    Patient Stressors: Occupational concerns  Patient Strengths: General fund of knowledge Physical Health Supportive family/friends  Treatment Modalities: Medication Management, Group therapy, Case management,  1 to 1 session with clinician, Psychoeducation, Recreational therapy.   Physician Treatment Plan for Primary Diagnosis: <principal problem not specified> Long Term Goal(s): Improvement in symptoms so as ready for discharge  Short Term Goals:    Medication Management: Evaluate patient's response, side effects, and tolerance of medication regimen.  Therapeutic Interventions: 1 to 1 sessions, Unit Group sessions and Medication administration.  Evaluation of Outcomes: Progressing  Physician Treatment Plan for Secondary Diagnosis: Active Problems:   MDD (major depressive  disorder), recurrent, severe, with psychosis ([place])   Long Term Goal(s): Improvement in symptoms so as ready for discharge  Short Term Goals:    Medication Management: Evaluate patient's response, side effects, and tolerance of medication regimen.  Therapeutic Interventions: 1 to 1 sessions, Unit Group sessions and Medication administration.  Evaluation of Outcomes: Progressing   RN Treatment Plan for Primary Diagnosis: <principal problem not specified> Long Term Goal(s): Knowledge of disease and therapeutic regimen to maintain health will improve  Short Term Goals: Ability to identify and develop effective coping behaviors will improve and Compliance with prescribed medications will improve  Medication Management: RN will administer medications as ordered by provider, will assess and evaluate patient's response and provide education to patient for prescribed medication. RN will report any adverse and/or side effects to prescribing provider.  Therapeutic Interventions: 1 on 1 counseling sessions, Psychoeducation, Medication administration, Evaluate responses to treatment, Monitor vital signs and CBGs as ordered, Perform/monitor CIWA, COWS, AIMS and Fall Risk screenings as ordered, Perform wound care treatments as ordered.  Evaluation of Outcomes: Progressing   LCSW Treatment Plan for Primary Diagnosis: <principal problem not specified> Long Term Goal(s): Safe transition to appropriate next level of care at discharge, Engage patient in therapeutic group addressing interpersonal concerns.  Short Term Goals: Engage patient in aftercare planning with referrals and resources  Therapeutic Interventions: Assess for all discharge needs, 1 to 1 time with Social worker, Explore available resources and support systems, Assess for adequacy in community support network, Educate family and significant other(s) on suicide prevention, Complete Psychosocial Assessment, Interpersonal group  therapy.  Evaluation of Outcomes: Met  Return home, follow up outpt  Progress in Treatment: Attending groups: Yes Participating in groups: Yes Taking medication as prescribed: Yes Toleration medication: Yes, no side effects reported at this time Family/Significant other contact made: Yes Patient understands diagnosis: No Limited insight Discussing patient identified problems/goals with staff: Yes Medical problems stabilized or resolved: Yes Denies suicidal/homicidal ideation: Yes Issues/concerns per patient self-inventory: None Other: N/A  New problem(s) identified: None identified at this time.   New Short Term/Long Term Goal(s): "I don't need help with anything."  Discharge Plan or Barriers:   Reason for Continuation of Hospitalization: Mood instability Delusions   Medication stabilization   Estimated Length of Stay: 1/15  Attendees: Patient: 01/17/2017  8:18 AM  Physician: Maris Berger, MD 01/17/2017  8:18 AM  Nursing: Elesa Massed, RN 01/17/2017  8:18 AM  RN Care Manager: Lars Pinks, RN 01/17/2017  8:18 AM  Social Worker: Ripley Fraise 01/17/2017  8:18 AM  Recreational Therapist: Winfield Cunas 01/17/2017  8:18 AM  Other: Norberto Sorenson 01/17/2017  8:18 AM  Other:  01/17/2017  8:18 AM    Scribe for Treatment Team:  Roque Lias LCSW 01/17/2017 8:18 AM

## 2017-01-17 NOTE — Progress Notes (Signed)
Patient denies SI, HI and AVH.  Patient has little insight as to what brought him into the hosptial.   Patient has attended groups and was compliant with medications.  Patient had no incidents of behavioral dyscontrol.   Assess patient for safety, offer medications as prescribed, engage patient in 1:1 staff talks.   Patient able to contract for safety, Continue to monitor as planned.

## 2017-01-17 NOTE — [facility] Group Notes (Signed)
[facility] Group Therapy  01/17/2017 , 12:56 PM    Type of Therapy:  [employer] Presentation  Participation Level:  Active  Participation Quality:  Attentive  Affect:  Blunted  Cognitive:  Oriented  Insight:  Limited  Engagement in Therapy:  Engaged  Modes of Intervention:  Discussion, Education and Socialization  Summary of Progress/Problems:  [name]  from [employer] came to present her recovery story, encourage group  members to share something about their story, and present information about the MHA.  Came initially-stayed for 20 minutes.  Left and did not return.  [name], [name] 01/17/2017 , 12:56 PM
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
## 2017-01-17 NOTE — Progress Notes (Signed)
Recreation Therapy Notes  Date: 01/17/17 Time: 1000 Location: 500 Hall Dayroom   Group Topic: Communication, Team Building, Problem Solving  Goal Area(s) Addresses:  Patient will effectively work with peer towards shared goal.  Patient will identify skills used to make activity successful.  Patient will identify how skills used during activity can be used to reach post d/c goals.   Behavioral Response: Engaged  Intervention: STEM Activity  Activity: Stage managerLanding Pad. In teams patients were given 12 plastic drinking straws and a length of masking tape. Using the materials provided patients were asked to build a landing pad to catch a golf ball dropped from approximately 6 feet in the air.   Education: Pharmacist, communityocial Skills, Discharge Planning   Education Outcome: Acknowledges education/In group clarification offered/Needs additional education.   Clinical Observations/Feedback: Pt arrived late so LRT explained the instructions to him.  Pt didn't seem to grasp the concept of the activity at first. Pt watched the other groups for awhile and then worked with his peer to complete the landing pad.  Pt stated with his support system, he needs to "vocalize to them what is going on so they will know what's wrong".   Caroll RancherMarjette Treyvone Chelf, LRT/CTRS     Caroll RancherLindsay, Vihan Santagata A 01/17/2017 12:23 PM

## 2017-01-17 NOTE — Progress Notes (Signed)
Recreation Therapy Notes  INPATIENT RECREATION THERAPY ASSESSMENT  Patient Details Name: Marquies A Colgan MRN: [id number] DOB: 07/27/1990 Today's Date: 01/17/2017  Patient Stressors: OtFestus Aloeher (Comment)(Need more space)  Pt stated he was screaming and yelling at work and then he was brought here.  Coping Skills:   Isolate, Exercise, Sports, Music, Talking, Art/Dance  Personal Challenges: (Pt did not identify any personal challenges.)  Leisure Interests (2+):  Music - Listen, Individual - Reading  Awareness of Community Resources:  Yes  Community Resources:  [employer], [employer]  Current Use: Yes  Patient Strengths:  Data processing manageratience; Communication  Patient Identified Areas of Improvement:  Coping skills; Get out of comfort zone  Current Recreation Participation:  Everyday  Patient Goal for Hospitalization:  "To relax and get to a calm space"  [place]  [place] Residence[place]  Current [place] (including self-harm):  No  Current HI:  No  Consent to Intern Participation: N/A    Caroll RancherMarjette Olina Melfi, LRT/CTRS  Caroll RancherLindsay, Orvill Coulthard A 01/17/2017, 1:47 PM

## 2017-01-18 DIAGNOSIS — G4700 Insomnia, unspecified: Secondary | ICD-10-CM

## 2017-01-18 DIAGNOSIS — Z87891 Personal history of nicotine dependence: Secondary | ICD-10-CM

## 2017-01-18 DIAGNOSIS — F1210 Cannabis abuse, uncomplicated: Secondary | ICD-10-CM

## 2017-01-18 MED ORDER — RISPERIDONE 1 MG PO TABS
1.0000 mg | ORAL_TABLET | Freq: Two times a day (BID) | ORAL | Status: DC
  Administered 2017-01-18 – 2017-01-19 (×4): 1 mg via ORAL
  Filled 2017-01-18 (×7): qty 1

## 2017-01-18 NOTE — [facility] Group Notes (Signed)
[facility] LCSW Group Therapy  01/18/2017  1:05 PM  Type of Therapy:  Group therapy  Participation Level:  Active  Participation Quality:  Attentive  Affect:  Flat  Cognitive:  Oriented  Insight:  Limited  Engagement in Therapy:  Limited  Modes of Intervention:  Discussion, Socialization  Summary of Progress/Problems:  Chaplain was here to lead a group on themes of hope and courage."Hope is not a concept I use.  I prefer to stay in the present, learn from my mistakes and experience, and progress." Talked about nature as a place that gives him hope, helps him feel centered.  Daryel Geraldorth, Charlton Boule B 01/18/2017 1:40 PM

## 2017-01-18 NOTE — Progress Notes (Signed)
D:Pt has had minimal interaction on the unit. He is fixated on leaving the hospital and minimizes his illness. He denies having voices today and has not been observed responding. Pt took his medication without complaint.  A:Offered support, encouragement and 15 minute checks.  Safety maintained on the unit.

## 2017-01-18 NOTE — Progress Notes (Signed)
Patient has been isolative to his room but did attend group this evening and has voiced no complaints. Patient currently denies having pain, -si/hi/a/v hall.  He reported that his goal was to discharge. Support and encouragement offered, safety maintained on unit, will continue to monitor.

## 2017-01-18 NOTE — Progress Notes (Signed)
Recreation Therapy Notes  Date: 01/18/17 Time: 1000 Location: 500 Hall Dayroom  Group Topic: Wellness  Goal Area(s) Addresses:  Patient will define components of whole wellness. Patient will verbalize benefit of whole wellness.  Behavioral Response: Engaged  Intervention: 2 Decks of Cards, music  Activity: Deck of Chance.  Each patient was given two cards from one deck.  LRT would pull a card from the second deck.  If LRT pulled either of the patients numbers, patients would have to do the exercise that corresponds with that number.    Education: Wellness, Building control surveyorDischarge Planning.   Education Outcome: Acknowledges education/In group clarification offered/Needs additional education.   Clinical Observations/Feedback: Pt was bright and engaged throughout activity.  Pt was able to complete all of his exercises and was smiling throughout group.    Caroll RancherMarjette Kamla Skilton, LRT/CTRS      Caroll RancherLindsay, Doug Bucklin A 01/18/2017 12:36 PM
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
## 2017-01-19 DIAGNOSIS — F1299 Cannabis use, unspecified with unspecified cannabis-induced disorder: Secondary | ICD-10-CM

## 2017-01-19 MED ORDER — BENZTROPINE MESYLATE 0.5 MG PO TABS
0.5000 mg | ORAL_TABLET | Freq: Two times a day (BID) | ORAL | Status: DC
  Administered 2017-01-19 – 2017-01-22 (×6): 0.5 mg via ORAL
  Filled 2017-01-19 (×11): qty 1
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
## 2017-01-19 NOTE — Progress Notes (Signed)
[facility] MD Progress Note  01/19/2017 1:41 PM [name]  MRN:  [id number]   Subjective:  [name] reports " I am feeling better"  Smiling, bright affect noted  Objective: [name] is awake, alert and oriented.  Denies suicidal or homicidal ideation. Reports taken medication as prescribed and tolerating medications well. Reports " I was having a syfy, like outer body experiences when my headaches started."  Reports auditory hallucination that started "back in September." currently reporting "muffled sounds"  Patient is pleasant, clam and cooperative. Reports a good appeite and reports he is resting well. Support, encouragement and reassurance was provided.     Principal Problem: Psychosis (HCC) Diagnosis:   Patient Active Problem List   Diagnosis Date Noted  . Psychosis (HCC) [F29] 01/17/2017  . 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 Screen for STD (sexually transmitted disease) [Z11.3] 09/14/2016  . Hyperesthesia [R20.3] 09/14/2016  . Testicular pain [N50.819] 09/14/2016  . Burning with urination [R30.0] 09/14/2016  . Left ear pain [H92.02] 09/14/2016  . Muscle strain [T14.8XXA] 11/09/2015  . Strain of back [S39.012A] 11/09/2015  . Back spasm [M62.830] 11/09/2015   Total Time spent with patient: 30 minutes  Past Psychiatric History: see H&P  Past Medical History:  Past Medical History:  Diagnosis Date  . Allergic rhinitis    mostly spring  . Strep throat 04/2013   obervation in ED due to high fever, illness    Past Surgical History:  Procedure Laterality Date  . ORCHIOPEXY     bilat, in childhood   Family History:  Family History  Problem Relation Age of Onset  . Other Mother        "tumor of stomach"  . Anemia Mother   . Other Father        unknown  . Diabetes Brother        borderline  . Hypertension Maternal Grandmother   . Cancer Neg Hx   . Stroke Neg Hx   . Heart disease Neg Hx   . Aneurysm  Neg Hx    Family Psychiatric  History: see H&P Social History:  Social History   Substance and Sexual Activity  Alcohol Use No     Social History   Substance and Sexual Activity  Drug Use Yes  . Types: Marijuana    Social History   Socioeconomic History  . Marital status: Single    Spouse name: None  . Number of children: None  . Years of education: None  . Highest education level: None  Social Needs  . Financial resource strain: None  . Food insecurity - worry: None  . Food insecurity - inability: None  . Transportation needs - medical: None  . Transportation needs - non-medical: None  Occupational History  . None  Tobacco Use  . Smoking status: Former Games developer  . Smokeless tobacco: Never Used  Substance and Sexual Activity  . Alcohol use: No  . Drug use: Yes    Types: Marijuana  . Sexual activity: Yes    Birth control/protection: None  Other Topics Concern  . 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   Lives with parents, from [place], high school at [place], [place] for a while, dropped out.  Wants to go into HVAC work.  Exercise - plays basketball regularly.  Currently trying to find work to [employer] at [employer] to return to school.   Prior  incarceration x 41mo for theft.     Additional Social History:                         Sleep: Good  Appetite:  Good  Current Medications: Current Facility-Administered Medications  Medication Dose Route Frequency Provider Last Rate Last Dose  . acetaminophen (TYLENOL) tablet 650 mg  650 mg Oral Q6H PRN Laveda Abbe, NP      . alum & mag hydroxide-simeth (MAALOX/MYLANTA) 200-200-20 MG/5ML suspension 30 mL  30 mL Oral Q4H PRN Laveda Abbe, NP      . benztropine (COGENTIN) tablet 0.5 mg  0.5 mg Oral BID Oneta Rack, NP   0.5 mg at 01/19/17 0944  . hydrOXYzine (ATARAX/VISTARIL) tablet 25 mg  25 mg Oral TID PRN Laveda Abbe, NP       . magnesium hydroxide (MILK OF MAGNESIA) suspension 30 mL  30 mL Oral Daily PRN Laveda Abbe, NP      . risperiDONE (RISPERDAL) tablet 1 mg  1 mg Oral BID Micheal Likens, MD   1 mg at 01/19/17 0944  . traZODone (DESYREL) tablet 50 mg  50 mg Oral QHS PRN Laveda Abbe, NP        Lab Results: No results found for this or any previous visit (from the past 48 hour(s)).  Blood Alcohol level:  Lab Results  Component Value Date   ETH <10 01/16/2017   ETH <10 10/22/2016    Metabolic Disorder Labs: No results found for: HGBA1C, MPG No results found for: PROLACTIN Lab Results  Component Value Date   CHOL 168 09/14/2016   TRIG 65 09/14/2016   HDL 55 09/14/2016   CHOLHDL 3.1 09/14/2016   VLDL 19 09/16/2013   LDLCALC 112 (H) 09/16/2013    Physical Findings: AIMS: Facial and Oral Movements Muscles of Facial Expression: None, normal Lips and Perioral Area: None, normal Jaw: None, normal Tongue: None, normal,Extremity Movements Upper (arms, wrists, hands, fingers): None, normal Lower (legs, knees, ankles, toes): None, normal, Trunk Movements Neck, shoulders, hips: None, normal, Overall Severity Severity of abnormal movements (highest score from questions above): None, normal Incapacitation due to abnormal movements: None, normal Patient's awareness of abnormal movements (rate only patient's report): No Awareness, Dental Status Current problems with teeth and/or dentures?: No Does patient usually wear dentures?: No  CIWA:    COWS:     Musculoskeletal: Strength & Muscle Tone: within normal limits Gait & Station: normal Patient leans: N/A  Psychiatric Specialty Exam: Physical Exam  Nursing note and vitals reviewed. Constitutional: He appears well-developed.  Neurological: He is alert.  Psychiatric: He has a normal mood and affect.    Review of Systems  Psychiatric/Behavioral: Negative for depression, hallucinations and suicidal ideas. The patient is  not nervous/anxious.   All other systems reviewed and are negative.   Blood pressure (!) 151/75, pulse 86, temperature 97.9 F (36.6 C), temperature source Oral, resp. rate 20, height 6\' 1"  (1.854 m), weight 83.5 kg (184 lb).Body mass index is 24.28 kg/m.  General Appearance: Casual and Fairly Groomed  Eye Contact:  Good  Speech:  Clear and Coherent and Normal Rate  Volume:  Normal  Mood:  Euthymic  Affect:  Appropriate and Congruent  Thought Process:  Coherent, Goal Directed and Descriptions of Associations: Loose  Orientation:  Full (Time, Place, and Person)  Thought Content:  Delusions, Hallucinations: Auditory and Rumination  Suicidal Thoughts:  No  Homicidal Thoughts:  No  Memory:  Immediate;   Good Recent;   Good Remote;   Good  Judgement:  Fair  Insight:  Lacking  Psychomotor Activity:  Normal  Concentration:  Concentration: Fair  Recall:  Good  Fund of Knowledge:  Good  Language:  Fair  Akathisia:  No  Handed:    AIMS (if indicated):     Assets:  Communication Skills Leisure Time Physical Health Resilience Social Support  ADL's:  Intact  Cognition:  WNL  Sleep:  Number of Hours: 5.25     Treatment Plan Summary: Daily contact with patient to assess and evaluate symptoms and progress in treatment and Medication management.    - Continue current treatment plan on 01/19/2017 except wherenoted  - Psychosis unspecified r/o primary psychotic disorder             - Continue Risperdal 1mg  po BID  - Anxiety             - Continue atarax q8h prn anxiety  - Insomnia             - Continue trazodone 50mg  po qhs prn insomnia - EPS      - Continue  cogentin 0.5mg  PO BID   -Encourage participation in groups and the therapeutic milieu  -Discharge planning will be ongoing    Oneta Rackanika N Lewis, NP 01/19/2017, 1:41 PM

## 2017-01-19 NOTE — [facility] Group Notes (Signed)
[facility] LCSW Group Therapy Note  Date/Time:    01/19/2017   11:30 AM - 12:00 PM  Type of Therapy and Topic:  Group Therapy:  Thought Distortions  Participation Level:  Active   Description of Group:  In this group, patients were asked to describe the last time they got upset and what their thinking about the situation was at that time.  They were then introduced to cognitive distortions and discussed how these can negatively affect their lives.  This included only a few cognitive distortion types, including  All or Nothing thinking, Focusing on the Negative, Blaming, Predicting the Future and Overgeneralization.  Therapeutic Goals: 1. Patient will be able to identify this exercise of examining their thoughts as a healthy coping mechanism. 2. Patient will identify a problem area in their life and the automatic thoughts they have about that situation. 3. Patient will verbalize the feelings and outcomes typically associated with their thoughts. 4. Patient will think about and discuss other more realistic conclusions/thoughts that could be more helpful.  Summary of Patient Progress:  The patient expressed that he does not get upset or happy.  He asked frequently "what's the question" and then would stare at the ground.  When he did speak it was not related to the topic at hand.   Therapeutic Modalities Cognitive Behavioral Therapy  Ambrose MantleMareida Grossman-Orr, LCSW 01/19/2017 12:00PM

## 2017-01-19 NOTE — Progress Notes (Signed)
Present in the milieu with little interaction with peers or staff. Denies all symptoms, SI, HI, AVH; guarded with this RN. Cooperative with unit activities and routine.

## 2017-01-20 MED ORDER — RISPERIDONE 1 MG PO TABS
1.0000 mg | ORAL_TABLET | Freq: Every day | ORAL | Status: DC
  Administered 2017-01-21 – 2017-01-22 (×2): 1 mg via ORAL
  Filled 2017-01-20 (×4): qty 1

## 2017-01-20 MED ORDER — RISPERIDONE 2 MG PO TABS
2.0000 mg | ORAL_TABLET | Freq: Every day | ORAL | Status: DC
  Administered 2017-01-20 – 2017-01-21 (×2): 2 mg via ORAL
  Filled 2017-01-20 (×4): qty 1

## 2017-01-20 NOTE — Progress Notes (Signed)
[facility] MD Progress Note  01/20/2017 8:11 AM APOSTOLOS BLAGG  MRN:  [id number]   Subjective:  [name] reports " I am ready to go home, I am feeling better"   Objective: [name] seen resting in dayroom. Patient continues to deny suicidal or homicidal ideation. Reports he is ready to get back to working. states he works in [employer]. Denies paranoid thoughts. Reports history of  auditory hallucinations in the past, however is no longer hearing voices or sounds during this assessment. Patient present with a pleasant and bright affect.  Denies experiencing  Headaches or having head pain. Patient is pleasant, clam, bright,smiling and cooperative. Reports a good appeite and reports he is resting well. Support, encouragement and reassurance was provided.     Principal Problem: Psychosis (HCC) Diagnosis:   Patient Active Problem List   Diagnosis Date Noted  . Psychosis (HCC) [F29] 01/17/2017  . 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 Screen for STD (sexually transmitted disease) [Z11.3] 09/14/2016  . Hyperesthesia [R20.3] 09/14/2016  . Testicular pain [N50.819] 09/14/2016  . Burning with urination [R30.0] 09/14/2016  . Left ear pain [H92.02] 09/14/2016  . Muscle strain [T14.8XXA] 11/09/2015  . Strain of back [S39.012A] 11/09/2015  . Back spasm [M62.830] 11/09/2015   Total Time spent with patient: 30 minutes  Past Psychiatric History: see H&P  Past Medical History:  Past Medical History:  Diagnosis Date  . Allergic rhinitis    mostly spring  . Strep throat 04/2013   obervation in ED due to high fever, illness    Past Surgical History:  Procedure Laterality Date  . ORCHIOPEXY     bilat, in childhood   Family History:  Family History  Problem Relation Age of Onset  . Other Mother        "tumor of stomach"  . Anemia Mother   . Other Father        unknown  . Diabetes Brother        borderline  .  Hypertension Maternal Grandmother   . Cancer Neg Hx   . Stroke Neg Hx   . Heart disease Neg Hx   . Aneurysm Neg Hx    Family Psychiatric  History: see H&P Social History:  Social History   Substance and Sexual Activity  Alcohol Use No     Social History   Substance and Sexual Activity  Drug Use Yes  . Types: Marijuana    Social History   Socioeconomic History  . Marital status: Single    Spouse name: None  . Number of children: None  . Years of education: None  . Highest education level: None  Social Needs  . Financial resource strain: None  . Food insecurity - worry: None  . Food insecurity - inability: None  . Transportation needs - medical: None  . Transportation needs - non-medical: None  Occupational History  . None  Tobacco Use  . Smoking status: Former Games developer  . Smokeless tobacco: Never Used  Substance and Sexual Activity  . Alcohol use: No  . Drug use: Yes    Types: Marijuana  . Sexual activity: Yes    Birth control/protection: None  Other Topics Concern  . 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   Lives with parents, from [place], high school at [place], [place] for a while, dropped out.  Wants to go into HVAC work.  Exercise - plays basketball regularly.  Currently trying to find work to [employer] at [employer] to return to school.   Prior incarceration x 9mo for theft.     Additional Social History:                         Sleep: Good  Appetite:  Good  Current Medications: Current Facility-Administered Medications  Medication Dose Route Frequency Provider Last Rate Last Dose  . acetaminophen (TYLENOL) tablet 650 mg  650 mg Oral Q6H PRN Laveda Abbe, NP      . alum & mag hydroxide-simeth (MAALOX/MYLANTA) 200-200-20 MG/5ML suspension 30 mL  30 mL Oral Q4H PRN Laveda Abbe, NP      . benztropine (COGENTIN) tablet 0.5 mg  0.5 mg Oral BID Oneta Rack, NP   0.5 mg at  01/19/17 1844  . hydrOXYzine (ATARAX/VISTARIL) tablet 25 mg  25 mg Oral TID PRN Laveda Abbe, NP      . magnesium hydroxide (MILK OF MAGNESIA) suspension 30 mL  30 mL Oral Daily PRN Laveda Abbe, NP      . Melene Muller ON 01/21/2017] risperiDONE (RISPERDAL) tablet 1 mg  1 mg Oral Daily Lillith Mcneff N, NP      . risperiDONE (RISPERDAL) tablet 2 mg  2 mg Oral QHS Oneta Rack, NP      . traZODone (DESYREL) tablet 50 mg  50 mg Oral QHS PRN Laveda Abbe, NP        Lab Results: No results found for this or any previous visit (from the past 48 hour(s)).  Blood Alcohol level:  Lab Results  Component Value Date   ETH <10 01/16/2017   ETH <10 10/22/2016    Metabolic Disorder Labs: No results found for: HGBA1C, MPG No results found for: PROLACTIN Lab Results  Component Value Date   CHOL 168 09/14/2016   TRIG 65 09/14/2016   HDL 55 09/14/2016   CHOLHDL 3.1 09/14/2016   VLDL 19 09/16/2013   LDLCALC 112 (H) 09/16/2013    Physical Findings: AIMS: Facial and Oral Movements Muscles of Facial Expression: None, normal Lips and Perioral Area: None, normal Jaw: None, normal Tongue: None, normal,Extremity Movements Upper (arms, wrists, hands, fingers): None, normal Lower (legs, knees, ankles, toes): None, normal, Trunk Movements Neck, shoulders, hips: None, normal, Overall Severity Severity of abnormal movements (highest score from questions above): None, normal Incapacitation due to abnormal movements: None, normal Patient's awareness of abnormal movements (rate only patient's report): No Awareness, Dental Status Current problems with teeth and/or dentures?: No Does patient usually wear dentures?: No  CIWA:    COWS:     Musculoskeletal: Strength & Muscle Tone: within normal limits Gait & Station: normal Patient leans: N/A  Psychiatric Specialty Exam: Physical Exam  Nursing note and vitals reviewed. Constitutional: He appears well-developed.  Cardiovascular:  Normal rate.  Neurological: He is alert.  Psychiatric: He has a normal mood and affect.    Review of Systems  Psychiatric/Behavioral: Negative for depression, hallucinations and suicidal ideas. The patient is not nervous/anxious.   All other systems reviewed and are negative.   Blood pressure (!) 151/75, pulse 86, temperature 97.9 F (36.6 C), temperature source Oral, resp. rate 20, height 6\' 1"  (1.854 m), weight 83.5 kg (184 lb).Body mass index is 24.28 kg/m.  General Appearance: Casual and Fairly Groomed  Eye Contact:  Good  Speech:  Clear and Coherent and Normal Rate  Volume:  Normal  Mood:  Euthymic  Affect:  Appropriate and Congruent  Thought Process:  Coherent, Goal Directed and Descriptions of Associations: Loose  Orientation:  Full (Time, Place, and Person)  Thought Content:  Delusions, Hallucinations: Auditory and Rumination  Suicidal Thoughts:  No  Homicidal Thoughts:  No  Memory:  Immediate;   Good Recent;   Good Remote;   Good  Judgement:  Fair  Insight:  Lacking  Psychomotor Activity:  Normal  Concentration:  Concentration: Fair  Recall:  Good  Fund of Knowledge:  Good  Language:  Fair  Akathisia:  No  Handed:    AIMS (if indicated):     Assets:  Communication Skills Leisure Time Physical Health Resilience Social Support  ADL's:  Intact  Cognition:  WNL  Sleep:  Number of Hours: 6.25     Treatment Plan Summary: Daily contact with patient to assess and evaluate symptoms and progress in treatment and Medication management.    - Continue current treatment plan on 01/20/2017 except wherenoted  - Psychosis unspecified r/o primary psychotic disorder             - Continue Risperdal 1mg  po QD and 2 mg po BID  - Anxiety             - Continue atarax q8h prn anxiety  - Insomnia             - Continue trazodone 50mg  po qhs prn insomnia - EPS  - Continue  cogentin 0.5mg  PO BID   -Encourage participation in groups and the therapeutic  milieu  -Discharge planning will be ongoing    Oneta Rackanika N Kaleesi Guyton, NP 01/20/2017, 8:11 AM

## 2017-01-20 NOTE — Progress Notes (Signed)
Patient continues to remain isolative to his room. He is complaint with his medication but did not attend group on tonight. He is very polite and pleasant when taking medication. Safety maintained on unit with 15 min checks.

## 2017-01-20 NOTE — [facility] Group Notes (Signed)
[facility]  Date/Time:  01/20/2017  11:00AM-12:00PM  Type of Therapy and Topic:  Group Therapy:  Music and Mood  Participation Level:  Active   Description of Group: In this process group, members listened to a variety of genres of music and identified that different types of music evoke different responses.  Patients were encouraged to identify music that was soothing for them and music that was energizing for them.  Patients discussed how this knowledge can help with wellness and recovery in various ways including managing depression and anxiety as well as encouraging healthy sleep habits.    Therapeutic Goals: 1. Patients will explore the impact of different varieties of music on mood 2. Patients will verbalize the thoughts they have when listening to different types of music 3. Patients will identify music that is soothing to them as well as music that is energizing to them 4. Patients will discuss how to use this knowledge to assist in maintaining wellness and recovery 5. Patients will explore the use of music as a coping skill  Summary of Patient Progress:  At the beginning of group, patient expressed he felt "good" and at the end of group said he felt "high."  Therapeutic Modalities: Solution Focused Brief Therapy Activity   Ambrose MantleMareida Grossman-Orr, LCSW

## 2017-01-21 NOTE — Progress Notes (Signed)
Recreation Therapy Notes  Date: 01/21/17 Time: 1000 Location: [address] Dayroom  Group Topic: Coping Skills  Goal Area(s) Addresses:  Patients will be able to identify positive coping skills. Patients will be able to identify the benefits of using coping skills post d/c.  Behavioral Response: Minimal  Intervention: Worksheet, dry erase board, dry erase marker  Activity: Mind map.  Patients were given a blank mind map.  LRT and patients filled out the first 8 boxes together with anxiety, depression, paranoia, fear, concentration, finances, hygiene and disappointment.  Individually, patients were to then come up with 3 coping skills for each area identified.  The group would then reconvene and LRT would put the coping skills on the board.  Education: PharmacologistCoping Skills, Building control surveyorDischarge Planning.   Education Outcome: Acknowledges understanding/In group clarification offered/Needs additional education.   Clinical Observations/Feedback: Pt was quiet and mostly observed.  Pt stated some coping skills as listening to music, watching television and reading.   Caroll RancherMarjette Nicodemus Denk, LRT/CTRS         Caroll RancherLindsay, Jameah Rouser A 01/21/2017 1:36 PM

## 2017-01-21 NOTE — Tx Team (Signed)
Interdisciplinary Treatment and Diagnostic Plan Update  01/21/2017 Time of Session: 11:46 AM  [name] MRN: [id number]  Principal Diagnosis: Psychosis [facility])  Secondary Diagnoses: Principal Problem:   Psychosis ([place])   Current Medications:  Current Facility-Administered Medications  Medication Dose Route Frequency Provider Last Rate Last Dose  . acetaminophen (TYLENOL) tablet 650 mg  650 mg Oral Q6H PRN Ethelene Hal, NP      . alum & mag hydroxide-simeth (MAALOX/MYLANTA) 200-200-20 MG/5ML suspension 30 mL  30 mL Oral Q4H PRN Ethelene Hal, NP      . benztropine (COGENTIN) tablet 0.5 mg  0.5 mg Oral BID Derrill Center, NP   0.5 mg at 01/21/17 4270  . hydrOXYzine (ATARAX/VISTARIL) tablet 25 mg  25 mg Oral TID PRN Ethelene Hal, NP      . magnesium hydroxide (MILK OF MAGNESIA) suspension 30 mL  30 mL Oral Daily PRN Ethelene Hal, NP      . risperiDONE (RISPERDAL) tablet 1 mg  1 mg Oral Daily Derrill Center, NP   1 mg at 01/21/17 6237  . risperiDONE (RISPERDAL) tablet 2 mg  2 mg Oral QHS Derrill Center, NP   2 mg at 01/20/17 2118  . traZODone (DESYREL) tablet 50 mg  50 mg Oral QHS PRN Ethelene Hal, NP        PTA Medications: No medications prior to admission.    Patient Stressors: Occupational concerns  Patient Strengths: General fund of knowledge Physical Health Supportive family/friends  Treatment Modalities: Medication Management, Group therapy, Case management,  1 to 1 session with clinician, Psychoeducation, Recreational therapy.   Physician Treatment Plan for Primary Diagnosis: Psychosis ([place]) Long Term Goal(s): Improvement in symptoms so as ready for discharge  Short Term Goals: Ability to identify and develop effective coping behaviors will improve Compliance with prescribed medications will improve Ability to identify triggers associated with substance abuse/mental health issues will improve  Medication Management:  Evaluate patient's response, side effects, and tolerance of medication regimen.  Therapeutic Interventions: 1 to 1 sessions, Unit Group sessions and Medication administration.  Evaluation of Outcomes: Adequate for Discharge  Physician Treatment Plan for Secondary Diagnosis: Principal Problem:   Psychosis ([place])   Long Term Goal(s): Improvement in symptoms so as ready for discharge  Short Term Goals: Ability to identify and develop effective coping behaviors will improve Compliance with prescribed medications will improve Ability to identify triggers associated with substance abuse/mental health issues will improve  Medication Management: Evaluate patient's response, side effects, and tolerance of medication regimen.  Therapeutic Interventions: 1 to 1 sessions, Unit Group sessions and Medication administration.  Evaluation of Outcomes: Adequate for Discharge   RN Treatment Plan for Primary Diagnosis: Psychosis ([place]) Long Term Goal(s): Knowledge of disease and therapeutic regimen to maintain health will improve  Short Term Goals: Ability to identify and develop effective coping behaviors will improve and Compliance with prescribed medications will improve  Medication Management: RN will administer medications as ordered by provider, will assess and evaluate patient's response and provide education to patient for prescribed medication. RN will report any adverse and/or side effects to prescribing provider.  Therapeutic Interventions: 1 on 1 counseling sessions, Psychoeducation, Medication administration, Evaluate responses to treatment, Monitor vital signs and CBGs as ordered, Perform/monitor CIWA, COWS, AIMS and Fall Risk screenings as ordered, Perform wound care treatments as ordered.  Evaluation of Outcomes: Adequate for Discharge   LCSW Treatment Plan for Primary Diagnosis: Psychosis ([place]) Long Term Goal(s): Safe transition to  appropriate next level of care at discharge, Engage  patient in therapeutic group addressing interpersonal concerns.  Short Term Goals: Engage patient in aftercare planning with referrals and resources  Therapeutic Interventions: Assess for all discharge needs, 1 to 1 time with Social worker, Explore available resources and support systems, Assess for adequacy in community support network, Educate family and significant other(s) on suicide prevention, Complete Psychosocial Assessment, Interpersonal group therapy.  Evaluation of Outcomes: Met  Return home, follow up outpt at [place] in Treatment: Attending groups: Yes Participating in groups: Yes Taking medication as prescribed: Yes Toleration medication: Yes, no side effects reported at this time Family/Significant other contact made: Yes Patient understands diagnosis: No Limited insight Discussing patient identified problems/goals with staff: Yes Medical problems stabilized or resolved: Yes Denies suicidal/homicidal ideation: Yes Issues/concerns per patient self-inventory: None Other: N/A  New problem(s) identified: None identified at this time.   New Short Term/Long Term Goal(s): "I don't need help with anything."  Discharge Plan or Barriers:   Reason for Continuation of Hospitalization:  Medication stabilization   Estimated Length of Stay: TRZNBV d/c tomorrow  Attendees: Patient: 01/21/2017  11:46 AM  Physician: Maris Berger, MD 01/21/2017  11:46 AM  Nursing: Elesa Massed, RN 01/21/2017  11:46 AM  RN Care Manager: Lars Pinks, RN 01/21/2017  11:46 AM  Social Worker: Ripley Fraise 01/21/2017  11:46 AM  Recreational Therapist: Winfield Cunas 01/21/2017  11:46 AM  Other: Norberto Sorenson 01/21/2017  11:46 AM  Other:  01/21/2017  11:46 AM    Scribe for Treatment Team:  Roque Lias LCSW 01/21/2017 11:46 AM

## 2017-01-21 NOTE — Progress Notes (Signed)
Did not attend group 

## 2017-01-21 NOTE — [facility] Group Notes (Signed)
LCSW Group Therapy Note   01/21/2017 1:15pm   Type of Therapy and Topic:  Group Therapy:  Positive Affirmations   Participation Level:  Minimal  Description of Group: This group addressed positive affirmation toward self and others. Patients went around the room and identified two positive things about themselves and two positive things about a peer in the room. Patients reflected on how it felt to share something positive with others, to identify positive things about themselves, and to hear positive things from others. Patients were encouraged to have a daily reflection of positive characteristics or circumstances.  Therapeutic Goals 1. Patient will verbalize two of their positive qualities 2. Patient will demonstrate empathy for others by stating two positive qualities about a peer in the group 3. Patient will verbalize their feelings when voicing positive self affirmations and when voicing positive affirmations of others 4. Patients will discuss the potential positive impact on their wellness/recovery of focusing on positive traits of self and others. Summary of Patient Progress:  Stayed the entire time, engaged throughout.  Stated that "I am learning from this experience," and that he is remaining patient and dealing with his time here "by getting into a routine and sticking with it."  Therapeutic Modalities Cognitive Behavioral Therapy Motivational Interviewing  Gene RogueRodney B Aymen Widrig, LCSW 01/21/2017 4:15 PM

## 2017-01-21 NOTE — Progress Notes (Signed)
Patient has been isolative to his room, Came to medication window and was compliant with medication. He reports that he was chillin today. Safety maintained on unit with 15 min checks.

## 2017-01-21 NOTE — Progress Notes (Signed)
  [facility]  Will you be returning to the same living situation after discharge:  Yes,  home At discharge, do you have transportation home?: Yes,  mother Do you have the ability to pay for your medications: Yes,  insurance  Release of information consent forms completed and in the chart;  Patient's signature needed at discharge.  Patient to Follow up at: Follow-up Information    [facility]04/2017.   Why:  Monday at 10AM with Dr A for your hospital follow up appointment.  Please bring along insurance card and hospital d/c paperwork Contact information: [address] [place] [id number] [phone]           Next level of care provider has [facility] to [facility] Link:no  Safety Planning and Suicide Prevention discussed: Yes,  yes  Have you used any form of tobacco in the last 30 days? (Cigarettes, Smokeless Tobacco, Cigars, and/or Pipes): No  Has patient been referred to the Quitline?: N/A patient is not a smoker  Patient has been referred for addiction treatment: N/A  Ida RogueRodney B Scottlyn Mchaney, LCSW 01/21/2017, 11:47 AM

## 2017-01-21 NOTE — Progress Notes (Signed)
Patient denies SI, HI and AVH.  Patient has been actively engaged in groups and unit activities.   Assess patient for safety, offer medications as prescribed, engage patient in 1:1 staff talks.   Continue to monitor as planned.

## 2017-01-21 NOTE — Progress Notes (Signed)
[facility] MD Progress Note  01/21/2017 1:39 PM [name]  MRN:  [id number] Subjective:    [name] is a 27 y/o M who was admitted on IVC with worsening symptoms of psychosis. Pt has unclear psychiatric history but he was evaluated on outpatient basis for AH bout 2-3 months ago. As per IVC paperwork, pt had incident at work where he was screaming which caused his employer to evacuate the building. Pt was endorsing hearing voices at that time. Pt was admitted to [facility] for additional evaluation and treatment, and he endorsed delusion that a microchip had been implanted in the back of his head, but it was only bothering him physically. He agreed to start trial of risperdal, which he has tolerated without difficulty or side effects, and his dose has been titrated up during his stay.   Today upon interview, pt reports that he is doing "pretty good." He denies any specific concerns today. He is sleeping well and his appetite is good. He denies SI/HI/AH/VH. He was asked specifically regarding the microchip in his head, and pt reports that he has been noticing it less, explaining, "It's gone down a lot." Pt reports he is tolerating his medications without difficulty or side effects. Pt feels that it would safe for him to return to home tomorrow, and we discussed that we will anticipate his discharge tomorrow if he has ongoing symptom stability. Pt was in agreement with the above plan, and he had no further questions, comments, or concerns.   Principal Problem: Psychosis (HCC) Diagnosis:   Patient Active Problem List   Diagnosis Date Noted  . Psychosis (HCC) [F29] 01/17/2017  . 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 Screen for STD (sexually transmitted disease) [Z11.3] 09/14/2016  . Hyperesthesia [R20.3] 09/14/2016  . Testicular pain [N50.819] 09/14/2016  . Burning with urination [R30.0] 09/14/2016  . Left ear pain [H92.02] 09/14/2016  . Muscle  strain [T14.8XXA] 11/09/2015  . Strain of back [S39.012A] 11/09/2015  . Back spasm [M62.830] 11/09/2015   Total Time spent with patient: 30 minutes  Past Psychiatric History: see H&P  Past Medical History:  Past Medical History:  Diagnosis Date  . Allergic rhinitis    mostly spring  . Strep throat 04/2013   obervation in ED due to high fever, illness    Past Surgical History:  Procedure Laterality Date  . ORCHIOPEXY     bilat, in childhood   Family History:  Family History  Problem Relation Age of Onset  . Other Mother        "tumor of stomach"  . Anemia Mother   . Other Father        unknown  . Diabetes Brother        borderline  . Hypertension Maternal Grandmother   . Cancer Neg Hx   . Stroke Neg Hx   . Heart disease Neg Hx   . Aneurysm Neg Hx    Family Psychiatric  History: see H&P Social History:  Social History   Substance and Sexual Activity  Alcohol Use No     Social History   Substance and Sexual Activity  Drug Use Yes  . Types: Marijuana    Social History   Socioeconomic History  . Marital status: Single    Spouse name: None  . Number of children: None  . Years of education: None  . Highest education level: None  Social Needs  . Financial resource strain: None  . Food insecurity -  worry: None  . Food insecurity - inability: None  . Transportation needs - medical: None  . Transportation needs - non-medical: None  Occupational History  . None  Tobacco Use  . Smoking status: Former Games developermoker  . Smokeless tobacco: Never Used  Substance and Sexual Activity  . Alcohol use: No  . Drug use: Yes    Types: Marijuana  . Sexual activity: Yes    Birth control/protection: None  Other Topics Concern  . 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   Works part time [employer].   Lives with parents, from [place], [place] school at [employer] for a while, dropped out.  Wants to go into HVAC work.  Exercise - plays basketball  regularly.  Currently trying to find work to [employer] bill at [employer] to return to school.   Prior incarceration x 55mo for theft.     Additional Social History:                         Sleep: Good  Appetite:  Good  Current Medications: Current Facility-Administered Medications  Medication Dose Route Frequency Provider Last Rate Last Dose  . acetaminophen (TYLENOL) tablet 650 mg  650 mg Oral Q6H PRN Laveda AbbeParks, Laurie Britton, NP      . alum & mag hydroxide-simeth (MAALOX/MYLANTA) 200-200-20 MG/5ML suspension 30 mL  30 mL Oral Q4H PRN Laveda AbbeParks, Laurie Britton, NP      . benztropine (COGENTIN) tablet 0.5 mg  0.5 mg Oral BID Oneta RackLewis, Tanika N, NP   0.5 mg at 01/21/17 [id number]  . hydrOXYzine (ATARAX/VISTARIL) tablet 25 mg  25 mg Oral TID PRN Laveda AbbeParks, Laurie Britton, NP      . magnesium hydroxide (MILK OF MAGNESIA) suspension 30 mL  30 mL Oral Daily PRN Laveda AbbeParks, Laurie Britton, NP      . risperiDONE (RISPERDAL) tablet 1 mg  1 mg Oral Daily Oneta RackLewis, Tanika N, NP   1 mg at 01/21/17 [id number]  . risperiDONE (RISPERDAL) tablet 2 mg  2 mg Oral QHS Oneta RackLewis, Tanika N, NP   2 mg at 01/20/17 2118  . traZODone (DESYREL) tablet 50 mg  50 mg Oral QHS PRN Laveda AbbeParks, Laurie Britton, NP        Lab Results: No results found for this or any previous visit (from the past 48 hour(s)).  Blood Alcohol level:  Lab Results  Component Value Date   ETH <10 01/16/2017   ETH <10 10/22/2016    Metabolic Disorder Labs: No results found for: HGBA1C, MPG No results found for: PROLACTIN Lab Results  Component Value Date   CHOL 168 09/14/2016   TRIG 65 09/14/2016   HDL 55 09/14/2016   CHOLHDL 3.1 09/14/2016   VLDL 19 09/16/2013   LDLCALC 112 (H) 09/16/2013    Physical Findings: AIMS: Facial and Oral Movements Muscles of Facial Expression: None, normal Lips and Perioral Area: None, normal Jaw: None, normal Tongue: None, normal,Extremity Movements Upper (arms, wrists, hands, fingers): None, normal Lower (legs, knees, ankles,  toes): None, normal, Trunk Movements Neck, shoulders, hips: None, normal, Overall Severity Severity of abnormal movements (highest score from questions above): None, normal Incapacitation due to abnormal movements: None, normal Patient's awareness of abnormal movements (rate only patient's report): No Awareness, Dental Status Current problems with teeth and/or dentures?: No Does patient usually wear dentures?: No  CIWA:    COWS:     Musculoskeletal: Strength & Muscle Tone: within normal limits Gait & Station:  normal Patient leans: N/A  Psychiatric Specialty Exam: Physical Exam  Nursing note and vitals reviewed.   Review of Systems  Constitutional: Negative for chills and fever.  Respiratory: Negative for cough and shortness of breath.   Cardiovascular: Negative for chest pain.  Gastrointestinal: Negative for heartburn and nausea.  Psychiatric/Behavioral: Negative for depression, hallucinations and suicidal ideas. The patient is not nervous/anxious.     Blood pressure 128/74, pulse (!) 114, temperature 98.6 F (37 C), temperature source Oral, resp. rate 20, height 6\' 1"  (1.854 m), weight 83.5 kg (184 lb).Body mass index is 24.28 kg/m.  General Appearance: Casual and Fairly Groomed  Eye Contact:  Good  Speech:  Clear and Coherent and Normal Rate  Volume:  Normal  Mood:  Euthymic  Affect:  Appropriate and Congruent  Thought Process:  Coherent and Goal Directed  Orientation:  Full (Time, Place, and Person)  Thought Content:  Delusions and Ideas of Reference:   Delusions  Suicidal Thoughts:  No  Homicidal Thoughts:  No  Memory:  Immediate;   Fair Recent;   Fair Remote;   Fair  Judgement:  Fair  Insight:  Lacking  Psychomotor Activity:  Normal  Concentration:  Concentration: Fair  Recall:  [employer] of Knowledge:  Fair  Language:  Fair  Akathisia:  No  Handed:    AIMS (if indicated):     Assets:  Manufacturing systems engineer Physical Health Resilience Social Support   ADL's:  Intact  Cognition:  WNL  Sleep:  Number of Hours: 5.75     Treatment Plan Summary: Daily contact with patient to assess and evaluate symptoms and progress in treatment and Medication management. Pt continues to endorse delusion of microchip implanted in the back of his head, but he is calm, cooperative, and pleasant. He is tolerating his medication without difficulty or side effects. He notes that intrusiveness of his delusions has diminished during his stay, and he will be appropriate to discharge to outpatient level of care tomorrow if he has ongoing symptom stability.  - Continue inpatient hospitalization  - Psychosis unspecified r/o primary psychotic disorder - Continue Risperdal 1mg  qAM and 2mg  qHS  - Anxiety - Continue atarax q8h prn anxiety  - Insomnia - Continue trazodone 50mg  po qhs prn insomnia  -Encourage participation in groups and the therapeutic milieu  -Discharge planning will be ongoing  Micheal Likens, MD 01/21/2017, 1:39 PM

## 2017-01-22 MED ORDER — HYDROXYZINE HCL 25 MG PO TABS: 25.0000 mg | ORAL_TABLET | Freq: Three times a day (TID) | ORAL | 0 refills | Status: DC | PRN

## 2017-01-22 MED ORDER — RISPERIDONE 1 MG PO TABS: ORAL_TABLET | ORAL | 0 refills | Status: DC

## 2017-01-22 MED ORDER — BENZTROPINE MESYLATE 0.5 MG PO TABS: 0.5000 mg | ORAL_TABLET | Freq: Two times a day (BID) | ORAL | 0 refills | Status: DC

## 2017-01-22 MED ORDER — TRAZODONE HCL 50 MG PO TABS: 50.0000 mg | ORAL_TABLET | Freq: Every evening | ORAL | 0 refills | Status: DC | PRN

## 2017-01-22 NOTE — Progress Notes (Signed)
Patient ID: [name], male   DOB: 12-03-1990, 27 y.o.   MRN: [id number] DAR Note: Pt at the time of assessment was resting in bed with eyes closed. Pt denied SI, anxiety, depression, pain, or AVH. Pt does not look to be in any distress at this time. All patient's questions and concerns addressed. Support, encouragement, and safe environment provided. 15-minute safety checks continue. Pt was med compliant. Safety checks continue.

## 2017-01-22 NOTE — Discharge Summary (Signed)
Physician Discharge Summary Note  Patient:  [name] is an 27 y.o., male  MRN:  [id number]  DOB:  08-17-1990  Patient phone:  [phone] (home)   Patient address:   [address] [place] [address],   Total Time spent with patient: Greater than 30 minutes  Date of Admission:  01/16/2017 Date of Discharge: 01-22-17  Reason for Admission: Worsening psychosis.  Principal Problem: Psychosis [facility])  Discharge Diagnoses: Patient Active Problem List   Diagnosis Date Noted  . Psychosis (HCC) [F29] 01/17/2017  . 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 Screen for STD (sexually transmitted disease) [Z11.3] 09/14/2016  . Hyperesthesia [R20.3] 09/14/2016  . Testicular pain [N50.819] 09/14/2016  . Burning with urination [R30.0] 09/14/2016  . Left ear pain [H92.02] 09/14/2016  . Muscle strain [T14.8XXA] 11/09/2015  . Strain of back [S39.012A] 11/09/2015  . Back spasm [M62.830] 11/09/2015   Past Psychiatric History: MDD, psychosis.  Past Medical History:  Past Medical History:  Diagnosis Date  . Allergic rhinitis    mostly spring  . Strep throat 04/2013   obervation in ED due to high fever, illness    Past Surgical History:  Procedure Laterality Date  . ORCHIOPEXY     bilat, in childhood   Family History:  Family History  Problem Relation Age of Onset  . Other Mother        "tumor of stomach"  . Anemia Mother   . Other Father        unknown  . Diabetes Brother        borderline  . Hypertension Maternal Grandmother   . Cancer Neg Hx   . Stroke Neg Hx   . Heart disease Neg Hx   . Aneurysm Neg Hx    Family Psychiatric  History: See H&P Social History:  Social History   Substance and Sexual Activity  Alcohol Use No     Social History   Substance and Sexual Activity  Drug Use Yes  . Types: Marijuana    Social History   Socioeconomic History  . Marital status: Single    Spouse name: None  .  Number of children: None  . Years of education: None  . Highest education level: None  Social Needs  . Financial resource strain: None  . Food insecurity - worry: None  . Food insecurity - inability: None  . Transportation needs - medical: None  . Transportation needs - non-medical: None  Occupational History  . None  Tobacco Use  . Smoking status: Former Games developer  . Smokeless tobacco: Never Used  Substance and Sexual Activity  . Alcohol use: No  . Drug use: Yes    Types: Marijuana  . Sexual activity: Yes    Birth control/protection: None  Other Topics Concern  . 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   Lives with parents, from [place], high school at [place], [place] for a while, dropped out.  Wants to go into HVAC work.  Exercise - plays basketball regularly.  Currently trying to find work to [employer] to return to school.   Prior incarceration x 70mo for theft.     Hospital Course: (Per admission assessment): [name] is a 27 y/o M who was admitted on IVC with worsening symptoms of psychosis. Pt has unclear psychiatric history but he was evaluated on outpatient basis for AH bout 2-3 months ago. As  per IVC paperwork, pt had incident at work where he was screaming which caused his employer to evacuate the building. Pt was endorsing hearing voices at that time and they were hurting him. Pt was given risperdal and [facility]. Upon initial evaluation, pt is pleasant and cooperative but he minimizes his presenting symptoms and seems minimally concerned with recent behaviors and symptoms which resulted in his hospitalization. Pt was asked why he came to the hospital, and he replied, "I was at work and then started screaming, and so the police came, and instead of jail they took me here".  [name] was admitted to the adult unit for worsening psychosis. He was involuntarily  committed to the hospital after an unprovoked screaming at his place of employment that led to evacuation of his fellow employees. He was in need of mood stabilization treatments.  During his admission assessment, [name] was evaluated and his symptoms were identified. The medication regimen was discussed and initiated targeting those presenting symptoms. He was oriented to the unit and encouraged to participate in the unit programming. He presented no other pre-existing medical problems that required treatment.       During the course of his hospital stay, Gene PaganiniRashawn was evaluated each day by a clinical provider to ascertain his response to his treatment regimen. The medication changes & adjustments were made according to his need. As the day goes by, improvement was noted as evidenced by his report of decreasing symptoms, improved sleep, appetite, affect, medication tolerance, behavior, and participation in the unit programming.  He was required on daily basis to complete a self inventory asssessment noting mood, mental status, pain, new symptoms, anxiety and concerns. His symptoms responded well to his treatment regimen. Also being in a therapeutic & supportive environment assisted in his mood stability. The providers & case manager worked diligently to develop a discharge plan with appropriate goals to maintain mood stability after discharge.   On this day of his hospital discharge, [name] was in much improved condition than upon admission. His symptoms were reported as significantly decreased or resolved completely. Upon discharge, he denies SI/HI and voiced no AVH. He was motivated to continue taking medication with a goal of continued improvement in mental health. He was medicated & discharge on; Benztropine 0.5 mg for EPS, Hydroxyzine 25 mg prn for anxiety, Risperdal 1 mg in am & 2 mg Q hs for mood control & Trazodone 50 mg for insomnia. He is recommended to continue mental health care on an outpatient  basis as noted below. He is provided with all the necessary information needed to make this appointments without problem.  He left [facility] in no apparent distress. Transportation per patient's family (mother).  Physical Findings: AIMS: Facial and Oral Movements Muscles of Facial Expression: None, normal Lips and Perioral Area: None, normal Jaw: None, normal Tongue: None, normal,Extremity Movements Upper (arms, wrists, hands, fingers): None, normal Lower (legs, knees, ankles, toes): None, normal, Trunk Movements Neck, shoulders, hips: None, normal, Overall Severity Severity of abnormal movements (highest score from questions above): None, normal Incapacitation due to abnormal movements: None, normal Patient's awareness of abnormal movements (rate only patient's report): No Awareness, Dental Status Current problems with teeth and/or dentures?: No Does patient usually wear dentures?: No  CIWA:    COWS:     Musculoskeletal: Strength & Muscle Tone: within normal limits Gait & Station: normal Patient leans: N/A  Psychiatric Specialty Exam: Physical Exam  Constitutional: He is oriented to person, place,  and time. He appears well-developed.  HENT:  Head: Normocephalic.  Eyes: Pupils are equal, round, and reactive to light.  Neck: Normal range of motion.  Cardiovascular: Normal rate.  Respiratory: Effort normal.  GI: Soft.  Genitourinary:  Genitourinary Comments: Deferred  Musculoskeletal: Normal range of motion.  Neurological: He is alert and oriented to person, place, and time.  Skin: Skin is warm and dry.    Review of Systems  Constitutional: Negative.   HENT: Negative.   Eyes: Negative.   Respiratory: Negative.   Cardiovascular: Negative.   Gastrointestinal: Negative.   Genitourinary: Negative.   Musculoskeletal: Negative.   Skin: Negative.   Neurological: Negative.   Endo/Heme/Allergies: Negative.   Psychiatric/Behavioral: Positive for depression (Stable) and  hallucinations (Hx. Psychosis). Negative for memory loss, substance abuse and suicidal ideas. The patient has insomnia (Stable). The patient is not nervous/anxious.     Blood pressure 131/76, pulse 90, temperature 97.7 F (36.5 C), resp. rate 20, height 6\' 1"  (1.854 m), weight 83.5 kg (184 lb).Body mass index is 24.28 kg/m.  See H&P   Have you used any form of tobacco in the last 30 days? (Cigarettes, Smokeless Tobacco, Cigars, and/or Pipes): No  Has this patient used any form of tobacco in the last 30 days? (Cigarettes, Smokeless Tobacco, Cigars, and/or Pipes): N/A  Blood Alcohol level:  Lab Results  Component Value Date   ETH <10 01/16/2017   ETH <10 10/22/2016   Metabolic Disorder Labs:  No results found for: HGBA1C, MPG No results found for: PROLACTIN Lab Results  Component Value Date   CHOL 168 09/14/2016   TRIG 65 09/14/2016   HDL 55 09/14/2016   CHOLHDL 3.1 09/14/2016   VLDL 19 09/16/2013   LDLCALC 112 (H) 09/16/2013    See Psychiatric Specialty Exam and Suicide Risk Assessment completed by Attending Physician prior to discharge.  Discharge destination:  Home  Is patient on multiple antipsychotic therapies at discharge:  No   Has Patient had three or more failed trials of antipsychotic monotherapy by history:  No  Recommended Plan for Multiple Antipsychotic Therapies: NA  Allergies as of 01/22/2017   No Known Allergies     Medication List    TAKE these medications     Indication  benztropine 0.5 MG tablet Commonly known as:  COGENTIN Take 1 tablet (0.5 mg total) by mouth 2 (two) times daily. For prevention of drug induced tremors  Indication:  Extrapyramidal Reaction caused by Medications   hydrOXYzine 25 MG tablet Commonly known as:  ATARAX/VISTARIL Take 1 tablet (25 mg total) by mouth 3 (three) times daily as needed for anxiety.  Indication:  Feeling Anxious   risperiDONE 1 MG tablet Commonly known as:  RISPERDAL Take 1 tablet (1 mg) by mouth in the  morning & 2 tablets (2 mg) at bedtime: For mood control  Indication:  Mood control   traZODone 50 MG tablet Commonly known as:  DESYREL Take 1 tablet (50 mg total) by mouth at bedtime as needed for sleep.  Indication:  Trouble Sleeping      Follow-up Information    [facility]2017.   Why:  Monday at 10AM with Dr A for your hospital follow up appointment.  Please bring along insurance card and hospital d/c paperwork Contact information: [address] [place] [address] [phone]          Follow-up recommendations: Activity:  As tolerated Diet: As recommended by your primary care doctor. Keep  all scheduled follow-up appointments as recommended.    Comments: Patient is instructed prior to discharge to: Take all medications as prescribed by his/her mental healthcare provider. Report any adverse effects and or reactions from the medicines to his/her outpatient provider promptly. Patient has been instructed & cautioned: To not engage in alcohol and or illegal drug use while on prescription medicines. In the event of worsening symptoms, patient is instructed to call the crisis hotline, 911 and or go to the nearest ED for appropriate evaluation and treatment of symptoms. To follow-up with his/her primary care provider for your other medical issues, concerns and or health care needs.   Signed: Armandina Stammer, NP, PMHNP, FNP-BC 01/22/2017, 10:11 AM   Patient seen, Suicide Assessment Completed.  Disposition Plan Reviewed   Breckyn Ticas is a 27 y/o M who was admitted on IVC with worsening symptoms of psychosis. Pt has unclear psychiatric history but he was evaluated on outpatient basis for AH about 2-3 months ago. As per IVC paperwork, pt had incident at work where he was screaming which caused his employer to evacuate the building. Pt was endorsing hearing voices at that time.[facility],  andhe endorsed delusion that a microchip had been implanted in the back of his head. He agreed tostart trial ofrisperdal, which he has tolerated without difficulty or side effects, and his dose has been titrated up during his stay. Pt has been calm, cooperative, and pleasant during his admission.  Today upon interview,pt reports that he is doing well overall, and he has no specific concerns. He denies SI/HI/AH/VH. He is sleeping well and his appetite is good. He continues to tolerate his medications without difficulty or side effect. He was asked directly about his delusion of microchip in the back of his head, and pt explains that it is not bothering him today. He rates the intensity of it's presence as "2 out of 10" and when he came it the intensity was "4 or 5 out of 10." He attributes the improvement of his symptoms to the medication, and he is future oriented about continuing to take his current medication regimen. Pt would like to discharge to home today. He was able to engage in safety planning including plan to return to [facility] or contact emergency services if he feels unable to maintain his own safety or the safety of others. Pt had no further questions, comments, or concerns.   Plan Of Care/Follow-up recommendations:   -Discharge to outpatient level of care  - Psychosis unspecified r/o primary psychotic disorder -ContinueRisperdal 1mg qAM and 2mg  qHS  - Anxiety -Continueatarax q8h prn anxiety  - Insomnia -Continuetrazodone 50mg  po qhs prn insomnia  Activity:  as tolerated Diet:  normal Tests:  NA Other:  see above for DC plan  Micheal Likens, MD

## 2017-01-22 NOTE — Progress Notes (Signed)
Recreation Therapy Notes  INPATIENT RECREATION TR PLAN  Patient Details Name: [name] MRN: [id number] DOB: 05-21-1990 Today's Date: 01/22/2017  Rec Therapy Plan Is patient appropriate for Therapeutic Recreation?: Yes Treatment times per week: about 3 days Estimated Length of Stay: 5-7 days TR Treatment/Interventions: Group participation (Comment)  Discharge Criteria Pt will be discharged from therapy if:: Discharged Treatment plan/goals/alternatives discussed and agreed upon by:: Patient/family  Discharge Summary Short term goals set: Pt will identify coping skills for stress at completion of recreation therapy sessions. Short term goals met: Complete Progress toward goals comments: Groups attended Which groups?: Wellness, Coping skills, Goal setting, Other (Comment)(Team building) Reason goals not met: None Therapeutic equipment acquired: N/A Reason patient discharged from therapy: Discharge from hospital Pt/family agrees with progress & goals achieved: Yes Date patient discharged from therapy: 01/22/17   Victorino Sparrow, LRT/CTRS  Ria Comment, Breanna Mcdaniel A 01/22/2017, 12:06 PM

## 2017-01-22 NOTE — Progress Notes (Addendum)
D: Assumed care of patient at [id number] from [place].  Patient observed up and visible in the milieu. Patient states he is ready for discharge though provides minimal info and is cautious. Avertive eye contact. Pleasant, cooperative. Denies AVH. Patient's affect anxious, blunted with pleasant, congruent mood. Per self inventory and discussions with writer, rates depression, hopelessness anxiety all at a 0/10. Rates sleep as good, appetite as good, energy as normal and concentration as good.  States goal for today is "going home." Denies pain, physical complaints. Patient verbalizes readiness for discharge.   A: Follow up plan explained, AVS, transition record and SRA given along with prescriptions. All belongings returned. Patient did not complete Suicide Safety Plan as he states he did not present with SI .   R: Patient verbalizes understanding. Denies SI/HI/AVH and assures this Clinical research associatewriter he will seek assistance should that change. Patient discharged ambulatory and in stable condition to mother.

## 2017-01-22 NOTE — Plan of Care (Signed)
Pt was able to identify coping skills at completion of coping skills recreation therapy session.   Marji Kuehnel, LRT/CTRS 

## 2017-01-22 NOTE — [facility] Suicide Risk Assessment (Signed)
[facility]   Principal Problem: Psychosis [facility](HCC) Discharge Diagnoses:  Patient Active Problem List   Diagnosis Date Noted  . Psychosis (HCC) [F29] 01/17/2017  . 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 Screen for STD (sexually transmitted disease) [Z11.3] 09/14/2016  . Hyperesthesia [R20.3] 09/14/2016  . Testicular pain [N50.819] 09/14/2016  . Burning with urination [R30.0] 09/14/2016  . Left ear pain [H92.02] 09/14/2016  . Muscle strain [T14.8XXA] 11/09/2015  . Strain of back [S39.012A] 11/09/2015  . Back spasm [M62.830] 11/09/2015    Total Time spent with patient: 30 minutes  Musculoskeletal: Strength & Muscle Tone: within normal limits Gait & Station: normal Patient leans: N/A  Psychiatric Specialty Exam: Review of Systems  Constitutional: Negative for chills and fever.  Respiratory: Negative for cough and shortness of breath.   Gastrointestinal: Negative for abdominal pain, heartburn, nausea and vomiting.  Psychiatric/Behavioral: Negative for depression, hallucinations and suicidal ideas. The patient is not nervous/anxious.     Blood pressure 131/76, pulse 90, temperature 97.7 F (36.5 C), resp. rate 20, height 6\' 1"  (1.854 m), weight 83.5 kg (184 lb).Body mass index is 24.28 kg/m.  General Appearance: Casual and Fairly Groomed  Patent attorneyye Contact::  Good  Speech:  Clear and Coherent and Normal Rate  Volume:  Normal  Mood:  Euthymic  Affect:  Congruent and Flat  Thought Process:  Coherent and Goal Directed  Orientation:  Full (Time, Place, and Person)  Thought Content:  Delusions, Hallucinations: Tactile and Ideas of Reference:   Delusions  Suicidal Thoughts:  No  Homicidal Thoughts:  No  Memory:  Immediate;   Fair Recent;   Fair Remote;   Fair  Judgement:  Fair  Insight:  Fair  Psychomotor Activity:  Normal  Concentration:  Good  Recall:  Good  Fund of Knowledge:Fair  Language: Fair   Akathisia:  No  Handed:    AIMS (if indicated):     Assets:  Communication Skills Desire for Improvement Physical Health Resilience Social Support  Sleep:  Number of Hours: 6.75  Cognition: WNL  ADL's:  Intact   Mental Status Per Nursing Assessment::   On Admission:  NA  Demographic Factors:  Male, Adolescent or young adult and Low socioeconomic status  Loss Factors: Financial problems/change in socioeconomic status  Historical Factors: Impulsivity  Risk Reduction Factors:   Positive social support, Positive therapeutic relationship and Positive coping skills or problem solving skills  Continued Clinical Symptoms:  Currently Psychotic  Cognitive Features That Contribute To Risk:  None    Suicide Risk:  Minimal: No identifiable suicidal ideation.  Patients presenting with no risk factors but with morbid ruminations; may be classified as minimal risk based on the severity of the depressive symptoms  Follow-up Information    [facility] on 02/04/2017.   Why:  Monday at 10AM with Dr A for your hospital follow up appointment.  Please bring along insurance card and hospital d/c paperwork Contact information: [address] [place] [address] [phone]         Subjective Data: Gene SickleRashawn Haynes is a 27 y/o M who was admitted on IVC with worsening symptoms of psychosis. Pt has unclear psychiatric history but he was evaluated on outpatient basis for AH about 2-3 months ago. As per IVC paperwork, pt had incident at work where he was screaming which caused his employer to evacuate the building. Pt was endorsing hearing voices at that time. Pt was  admitted to [facility] for additional evaluation and treatment, and he endorsed delusion that a microchip had been implanted in the back of his head. He agreed to start trial of risperdal, which he has tolerated without difficulty or side effects, and his dose has been titrated up during his stay. Pt has  been calm, cooperative, and pleasant during his admission.   Today upon interview, pt reports that he is doing well overall, and he has no specific concerns. He denies SI/HI/AH/VH. He is sleeping well and his appetite is good. He continues to tolerate his medications without difficulty or side effect. He was asked directly about his delusion of microchip in the back of his head, and pt explains that it is not bothering him today. He rates the intensity of it's presence as "2 out of 10" and when he came it the intensity was "4 or 5 out of 10." He attributes the improvement of his symptoms to the medication, and he is future oriented about continuing to take his current medication regimen. Pt would like to discharge to home today. He was able to engage in safety planning including plan to return to [facility] or contact emergency services if he feels unable to maintain his own safety or the safety of others. Pt had no further questions, comments, or concerns.    Plan Of Care/Follow-up recommendations:   -Discharge to outpatient level of care  - Psychosis unspecified r/o primary psychotic disorder -Continue Risperdal 1mg  qAM and 2mg  qHS  - Anxiety -Continueatarax q8h prn anxiety  - Insomnia -Continuetrazodone 50mg  po qhs prn insomnia  Activity:  as tolerated Diet:  normal Tests:  NA Other:  see above for DC plan  Micheal Likens, MD 01/22/2017, 10:00 AM

## 2017-01-22 NOTE — Progress Notes (Signed)
Recreation Therapy Notes  Date: 01/22/17 Time: 1000 Location: [address] Dayroom  Group Topic: Leisure Education, Goal Setting  Goal Area(s) Addresses:  Patient will be able to identify at least 3 life goals.  Patient will be able to identify benefit of investing in life goals.  Patient will be able to identify benefit of setting life goals.   Behavioral Response:  Engaged  Intervention: Worksheet  Activity: Garment/textile technologistGoal Planning.  Patients were given a worksheet in which they had to identify goals they wanted to accomplish within the next week, month, year and five years.  Patients were to then identify the obstacles to reaching their goals, what they will need to do to achieve their goals and what they can start doing immediately to work towards their goals.  Education:  Discharge Planning, PharmacologistCoping Skills, Leisure Education   Education Outcome: Acknowledges Education/In Group Clarification Provided/Needs Additional Education  Clinical Observation:  Pt stated a goal "gives you purpose".  Pt stated in the next week, he wants to return to work; get paid within the next month; get taxes next year and get his own place and car in the next five years.  Pt identified his obstacle as going to school; needs to be patient and ask for help when needed to achieve goals; and he can start tomorrow by calling into his job.  Pt was pleasant, engaged when prompted.     Caroll RancherMarjette Cameran Pettey, LRT/CTRS      Caroll RancherLindsay, Lamari Youngers A 01/22/2017 11:38 AM

## 2017-04-11 ENCOUNTER — Other Ambulatory Visit: Payer: Self-pay

## 2017-04-11 ENCOUNTER — Encounter (HOSPITAL_COMMUNITY): Payer: Self-pay | Admitting: *Deleted

## 2017-04-11 ENCOUNTER — Emergency Department (HOSPITAL_COMMUNITY)
Admission: EM | Admit: 2017-04-11 | Discharge: 2017-04-12 | Disposition: A | Discharge status: Other Acute Inpatient Hospital | Payer: Medicaid Other | Attending: Emergency Medicine | Admitting: Emergency Medicine

## 2017-04-11 DIAGNOSIS — F251 Schizoaffective disorder, depressive type: Secondary | ICD-10-CM

## 2017-04-11 DIAGNOSIS — Z046 Encounter for general psychiatric examination, requested by authority: Secondary | ICD-10-CM | POA: Diagnosis not present

## 2017-04-11 DIAGNOSIS — R443 Hallucinations, unspecified: Secondary | ICD-10-CM | POA: Diagnosis present

## 2017-04-11 DIAGNOSIS — Z79899 Other long term (current) drug therapy: Secondary | ICD-10-CM | POA: Insufficient documentation

## 2017-04-11 DIAGNOSIS — Z87891 Personal history of nicotine dependence: Secondary | ICD-10-CM | POA: Insufficient documentation

## 2017-04-11 DIAGNOSIS — F333 Major depressive disorder, recurrent, severe with psychotic symptoms: Secondary | ICD-10-CM

## 2017-04-11 HISTORY — DX: Major depressive disorder, single episode, unspecified: F32.9

## 2017-04-11 HISTORY — DX: Depression, unspecified: F32.A

## 2017-04-11 LAB — CBC WITH DIFFERENTIAL/PLATELET
BASOS PCT: 0 %
Basophils Absolute: 0 10*3/uL (ref 0.0–0.1)
EOS PCT: 1 %
Eosinophils Absolute: 0.1 10*3/uL (ref 0.0–0.7)
HEMATOCRIT: 43.2 % (ref 39.0–52.0)
Hemoglobin: 14.9 g/dL (ref 13.0–17.0)
Lymphocytes Relative: 36 %
Lymphs Abs: 2.9 10*3/uL (ref 0.7–4.0)
MCH: 30.2 pg (ref 26.0–34.0)
MCHC: 34.5 g/dL (ref 30.0–36.0)
MCV: 87.4 fL (ref 78.0–100.0)
MONO ABS: 0.8 10*3/uL (ref 0.1–1.0)
MONOS PCT: 10 %
NEUTROS ABS: 4.3 10*3/uL (ref 1.7–7.7)
Neutrophils Relative %: 53 %
Platelets: 323 10*3/uL (ref 150–400)
RBC: 4.94 MIL/uL (ref 4.22–5.81)
RDW: 11.9 % (ref 11.5–15.5)
WBC: 8.2 10*3/uL (ref 4.0–10.5)

## 2017-04-11 LAB — COMPREHENSIVE METABOLIC PANEL
ALBUMIN: 4.9 g/dL (ref 3.5–5.0)
ALT: 37 U/L (ref 17–63)
AST: 41 U/L (ref 15–41)
Alkaline Phosphatase: 61 U/L (ref 38–126)
Anion gap: 11 (ref 5–15)
BILIRUBIN TOTAL: 1 mg/dL (ref 0.3–1.2)
BUN: 14 mg/dL (ref 6–20)
CO2: 27 mmol/L (ref 22–32)
CREATININE: 1.18 mg/dL (ref 0.61–1.24)
Calcium: 9.9 mg/dL (ref 8.9–10.3)
Chloride: 101 mmol/L (ref 101–111)
GFR calc Af Amer: >60 mL/min (ref >60)
GFR calc non Af Amer: >60 mL/min (ref >60)
GLUCOSE: 87 mg/dL (ref 65–99)
POTASSIUM: 3.6 mmol/L (ref 3.5–5.1)
Sodium: 139 mmol/L (ref 135–145)
TOTAL PROTEIN: 8.4 g/dL — AB (ref 6.5–8.1)

## 2017-04-11 LAB — ACETAMINOPHEN LEVEL: Acetaminophen (Tylenol), Serum: <10 ug/mL — ABNORMAL LOW (ref 10–30)

## 2017-04-11 LAB — ETHANOL: Alcohol, Ethyl (B): <10 mg/dL (ref <10)

## 2017-04-11 NOTE — ED Notes (Signed)
Pt stated "vegan, buddha, no disrespect" when questioned if he did drugs.  "I'm vegan now.  I was out in the wilderness or was I scared?"

## 2017-04-11 NOTE — Progress Notes (Signed)
[facility] pending medical clearance including review of UDS and stabilized vitals. RN [facility].   Princess BruinsAquicha Vu Liebman, MSW, LCSW Therapeutic Triage Specialist  [phone]

## 2017-04-11 NOTE — Progress Notes (Signed)
TTS consulted with Nira ConnJason Berry, NP who recommends inpt treatment. EDP Melene PlanFloyd, Dan, DO and pt's nurse Darel HongJudy, RN have been advised of the recommendation. [facility]  Princess BruinsAquicha Jaki Steptoe, MSW, LCSW Therapeutic Triage Specialist  [phone]

## 2017-04-11 NOTE — ED Provider Notes (Signed)
[facility]-EMERGENCY DEPT Provider Note   CSN: [id number] Arrival date & time: 04/11/17  1922     History   Chief Complaint Chief Complaint  Patient presents with  . IVC  . psychosis    HPI [name] is a 27 y.o. male.  27 yo M with a cc of psychosis.  The patient has a history of depression with psychotic features.  He has not been taking his medicines for the past week or so.  He is IVC by his facility because he has been hallucinating and not following direction.  Patient does not provide any further history other than he is not having chest pain shortness of breath abdominal pain nausea vomiting or diarrhea.  The history is provided by the patient.  Illness  This is a new problem. The current episode started less than 1 hour ago. The problem occurs constantly. The problem has not changed since onset.Pertinent negatives include no chest pain, no abdominal pain, no headaches and no shortness of breath. Nothing aggravates the symptoms. Nothing relieves the symptoms. He has tried nothing for the symptoms. The treatment provided no relief.    Past Medical History:  Diagnosis Date  . Allergic rhinitis    mostly spring  . Depression   . Strep throat 04/2013   obervation in ED due to high fever, illness    Patient Active Problem List   Diagnosis Date Noted  . Psychosis (HCC) 01/17/2017  . MDD (major depressive disorder), recurrent, severe, with psychosis (HCC) 01/16/2017  . Screen for STD (sexually transmitted disease) 09/14/2016  . Hyperesthesia 09/14/2016  . Testicular pain 09/14/2016  . Burning with urination 09/14/2016  . Left ear pain 09/14/2016  . Muscle strain 11/09/2015  . Strain of back 11/09/2015  . Back spasm 11/09/2015    Past Surgical History:  Procedure Laterality Date  . ORCHIOPEXY     bilat, in childhood        Home Medications    Prior to Admission medications   Medication Sig Start Date End Date Taking? Authorizing  Provider  benztropine (COGENTIN) 0.5 MG tablet Take 1 tablet (0.5 mg total) by mouth 2 (two) times daily. For prevention of drug induced tremors 01/22/17   Armandina Stammer I, NP  hydrOXYzine (ATARAX/VISTARIL) 25 MG tablet Take 1 tablet (25 mg total) by mouth 3 (three) times daily as needed for anxiety. 01/22/17   Armandina Stammer I, NP  risperiDONE (RISPERDAL) 1 MG tablet Take 1 tablet (1 mg) by mouth in the morning & 2 tablets (2 mg) at bedtime: For mood control 01/22/17   Armandina Stammer I, NP  traZODone (DESYREL) 50 MG tablet Take 1 tablet (50 mg total) by mouth at bedtime as needed for sleep. 01/22/17   Sanjuana Kava, NP    Family History Family History  Problem Relation Age of Onset  . Other Mother        "tumor of stomach"  . Anemia Mother   . Other Father        unknown  . Diabetes Brother        borderline  . Hypertension Maternal Grandmother   . Cancer Neg Hx   . Stroke Neg Hx   . Heart disease Neg Hx   . Aneurysm Neg Hx     Social History Social History   Tobacco Use  . Smoking status: Former Games developer  . Smokeless tobacco: Never Used  Substance Use Topics  . Alcohol use: No  . Drug use:  Yes    Types: Marijuana     Allergies   Patient has no known allergies.   Review of Systems Review of Systems  Constitutional: Negative for chills and fever.  HENT: Negative for congestion and facial swelling.   Eyes: Negative for discharge and visual disturbance.  Respiratory: Negative for shortness of breath.   Cardiovascular: Negative for chest pain and palpitations.  Gastrointestinal: Negative for abdominal pain, diarrhea and vomiting.  Musculoskeletal: Negative for arthralgias and myalgias.  Skin: Negative for color change and rash.  Neurological: Negative for tremors, syncope and headaches.  Psychiatric/Behavioral: Negative for confusion and dysphoric mood.     Physical Exam Updated Vital Signs BP 135/78 (BP Location: Right Arm)   Pulse 86   Temp 97.9 F (36.6 C) (Oral)    Resp 18   Ht 5\' 11"  (1.803 m)   Wt 90.7 kg (200 lb)   SpO2 99%   BMI 27.89 kg/m   Physical Exam  Constitutional: He is oriented to person, place, and time. He appears well-developed and well-nourished.  HENT:  Head: Normocephalic and atraumatic.  Eyes: Pupils are equal, round, and reactive to light. EOM are normal.  Neck: Normal range of motion. Neck supple. No JVD present.  Cardiovascular: Normal rate and regular rhythm. Exam reveals no gallop and no friction rub.  No murmur heard. Pulmonary/Chest: No respiratory distress. He has no wheezes.  Abdominal: He exhibits no distension and no mass. There is no tenderness. There is no rebound and no guarding.  Musculoskeletal: Normal range of motion.  Neurological: He is alert and oriented to person, place, and time.  Skin: No rash noted. No pallor.  Psychiatric: He has a normal mood and affect. His behavior is normal.  Nursing note and vitals reviewed.    ED Treatments / Results  Labs (all labs ordered are listed, but only abnormal results are displayed) Labs Reviewed  COMPREHENSIVE METABOLIC PANEL - Abnormal; Notable for the following components:      Result Value   Total Protein 8.4 (*)    All other components within normal limits  ACETAMINOPHEN LEVEL - Abnormal; Notable for the following components:   Acetaminophen (Tylenol), Serum <10 (*)    All other components within normal limits  ETHANOL  CBC WITH DIFFERENTIAL/PLATELET  RAPID URINE DRUG SCREEN, HOSP PERFORMED    EKG None  Radiology No results found.  Procedures Procedures (including critical care time)  Medications Ordered in ED Medications - No data to display   Initial Impression / Assessment and Plan / ED Course  I have reviewed the triage vital signs and the nursing notes.  Pertinent labs & imaging results that were available during my care of the patient were reviewed by me and considered in my medical decision making (see chart for details).     27  yo M with known mental illness here with mental illness when he is not taking his medications.  TTS evaluation.  Feel he is medically clear.  TTS will place in inpatient.   The patients results and plan were reviewed and discussed.   Any x-rays performed were independently reviewed by myself.   Differential diagnosis were considered with the presenting HPI.  Medications - No data to display  Vitals:   04/11/17 1944 04/11/17 1947 04/11/17 2202  BP:  (!) 150/83 135/78  Pulse:  (!) 101 86  Resp:  (!) 28 18  Temp:  98.3 F (36.8 C) 97.9 F (36.6 C)  TempSrc:  Oral Oral  SpO2:  99% 99%  Weight: 90.7 kg (200 lb)    Height: 5\' 11"  (1.803 m)      Final diagnoses:  Schizoaffective disorder, depressive type [facility](HCC)      Final Clinical Impressions(s) / ED Diagnoses   Final diagnoses:  Schizoaffective disorder, depressive type [facility](HCC)    ED Discharge Orders    None       Melene PlanFloyd, Tacari Repass, DO 04/11/17 2341

## 2017-04-11 NOTE — ED Notes (Signed)
SBAR Report received from previous nurse. Pt received calm and visible on unit. Pt denies currentHI, A/V H at this time, and appears otherwise stable and free of distress. When asked about depression pt reported 11/10 and same with anxiety pt endorses migraines and a desire to harm his mother Pt reminded of camera surveillance, q 15 min rounds, and rules of the milieu. Pt  Will continue to assess.

## 2017-04-11 NOTE — ED Notes (Signed)
[facility] Expected date:  Expected time:  Means of arrival:  Comments: Roseanne RenoStewart IVC

## 2017-04-11 NOTE — BH Assessment (Addendum)
Assessment Note  [name] is an 27 y.o. male who presents to the ED under IVC initiated by his mother. According to the IVC, the pt has been "diagnosed with depression with psychosis. Respondent will not take prescribed medication. He talks to himself and Corrie DandyHarriet Tubman, Ignacia BayleyMalcolm X and Darius BumpMartin Luther King Jr. He thinks he has a micro chip in his head." TTS spoke with the pt's mother who states the pt is followed by [facility] but he refuses to comply with any type of medication management or mental health regimine. Mom states the pt has become more unpredictable, feeds random animals all over the neighborhood including birds and squirrels, and walks aimlessly into other people's yards and into the road.  When TTS entered the room the pt initially shared that he wants to kill the person that sent him to the hospital (his mother). Pt states that he is suicidal but when asked about a plan pt stated "yeah, my plan is to commit suicide." Pt states he experiences AVH but he refuses to share any information with this Clinical research associatewriter. Pt stated "I can't tell you that" when asked about AVH. Pt then stopped responding to this writer and stated "I don't want to talk to you anymore. I want some fruits and vegetables."   TTS consulted with Nira ConnJason Berry, NP who recommends inpt treatment. EDP Melene PlanFloyd, Dan, DO and pt's nurse Darel HongJudy, RN have been advised of the recommendation.   Diagnosis: Schizophrenia   Past Medical History:  Past Medical History:  Diagnosis Date  . Allergic rhinitis    mostly spring  . Depression   . Strep throat 04/2013   obervation in ED due to high fever, illness    Past Surgical History:  Procedure Laterality Date  . ORCHIOPEXY     bilat, in childhood    Family History:  Family History  Problem Relation Age of Onset  . Other Mother        "tumor of stomach"  . Anemia Mother   . Other Father        unknown  . Diabetes Brother        borderline  . Hypertension  Maternal Grandmother   . Cancer Neg Hx   . Stroke Neg Hx   . Heart disease Neg Hx   . Aneurysm Neg Hx     Social History:  reports that he has quit smoking. He has never used smokeless tobacco. He reports that he has current or past drug history. Drug: Marijuana. He reports that he does not drink alcohol.  Additional Social History:  Alcohol / Drug Use Pain Medications: See MAR Prescriptions: See MAR Over the Counter: See MAR History of alcohol / drug use?: No history of alcohol / drug abuse  CIWA: CIWA-Ar BP: (!) 150/83 Pulse Rate: (!) 101 COWS:    Allergies: No Known Allergies  Home Medications:  (Not in a hospital admission)  OB/GYN Status:  No LMP for male patient.  General Assessment Data Location of Assessment: [facility] ED TTS Assessment: In system Is this a Tele or Face-to-Face Assessment?: Face-to-Face Is this an Initial Assessment or a Re-assessment for this encounter?: Initial Assessment Marital status: Single Is patient pregnant?: No Pregnancy Status: No Living Arrangements: Parent Can pt return to current living arrangement?: Yes Admission Status: Involuntary Is patient capable of signing voluntary admission?: No Referral Source: Self/Family/Friend Insurance type: none     Crisis Care Plan Living Arrangements: Parent Name of Psychiatrist: pt denies Name of Therapist: pt  denies  Education Status Is patient currently in school?: No Is the patient employed, unemployed or receiving disability?: Unemployed  Risk to self with the past 6 months Suicidal Ideation: Yes-Currently Present Has patient been a risk to self within the past 6 months prior to admission? : Other (comment)(pt refused to disclose info to this Clinical research associate) Suicidal Intent: (pt refused to disclose info to this Clinical research associate) Has patient had any suicidal intent within the past 6 months prior to admission? : (pt refused to disclose info to this Clinical research associate) Is patient at risk for suicide?: (pt refused to  disclose info to this Clinical research associate) Suicidal Plan?: Yes-Currently Present Has patient had any suicidal plan within the past 6 months prior to admission? : Yes Specify Current Suicidal Plan: pt said "yes my plan is to commit suicide", does not provide further details  Access to Means: (pt refused to disclose info to this Clinical research associate) What has been your use of drugs/alcohol within the last 12 months?: pt refused to disclose info to this Clinical research associate Previous Attempts/Gestures: (pt refused to disclose info to this Clinical research associate) Triggers for Past Attempts: Other (Comment)(pt refused to disclose info to this Clinical research associate) Intentional Self Injurious Behavior: (pt refused to disclose info to this Clinical research associate) Family Suicide History: Unable to assess Recent stressful life event(s): Other (Comment)(AVH) Persecutory voices/beliefs?: (pt refused to disclose info to this Clinical research associate) Depression: (pt refused to disclose info to this Clinical research associate) Depression Symptoms: Feeling angry/irritable Substance abuse history and/or treatment for substance abuse?: (pt refused to disclose info to this Clinical research associate) Suicide prevention information given to non-admitted patients: Not applicable  Risk to Others within the past 6 months Homicidal Ideation: Yes-Currently Present Does patient have any lifetime risk of violence toward others beyond the six months prior to admission? : Yes (comment) Thoughts of Harm to Others: Yes-Currently Present Comment - Thoughts of Harm to Others: pt states he would like to kill the person that IVC'd him  Current Homicidal Intent: No Current Homicidal Plan: No Access to Homicidal Means: No History of harm to others?: No Assessment of Violence: None Noted Does patient have access to weapons?: (pt refused to disclose info to this Clinical research associate) Criminal Charges Pending?: (pt refused to disclose info to this Clinical research associate) Does patient have a court date: (pt refused to disclose info to this Clinical research associate) Is patient on probation?:  Unknown  Psychosis Hallucinations: Auditory, Visual, With command Delusions: Grandiose  Mental Status Report Appearance/Hygiene: Bizarre Eye Contact: Poor Motor Activity: Freedom of movement, Unremarkable Speech: Elective mutism Level of Consciousness: Quiet/awake Mood: Labile Affect: Inconsistent with thought content Anxiety Level: None Thought Processes: Irrelevant, Flight of Ideas, Thought Blocking Judgement: Impaired Orientation: Person, Place, Time, Situation Obsessive Compulsive Thoughts/Behaviors: None  Cognitive Functioning Concentration: Fair Memory: Remote Intact, Recent Intact Is patient IDD: No Is patient DD?: No Insight: Poor Impulse Control: Poor Appetite: Good Have you had any weight changes? : No Change Sleep: Unable to Assess Vegetative Symptoms: None  ADLScreening [facility] Assessment Services) Patient's cognitive ability adequate to safely complete daily activities?: Yes Patient able to express need for assistance with ADLs?: Yes Independently performs ADLs?: Yes (appropriate for developmental age)  Prior Inpatient Therapy Prior Inpatient Therapy: Yes Prior Therapy Dates: 2019 Prior Therapy Facilty/Provider(s): [facility] Reason for Treatment: Psychosis   Prior Outpatient Therapy Prior Outpatient Therapy: No Does patient have an ACCT team?: No Does patient have Intensive In-House Services?  : No Does patient have [facility] services? : No Does patient have P4CC services?: No  ADL Screening (condition at time of admission) Patient's  cognitive ability adequate to safely complete daily activities?: Yes Is the patient deaf or have difficulty hearing?: No Does the patient have difficulty seeing, even when wearing glasses/contacts?: No Does the patient have difficulty concentrating, remembering, or making decisions?: Yes Patient able to express need for assistance with ADLs?: Yes Does the patient have difficulty dressing or bathing?: No Independently performs  ADLs?: Yes (appropriate for developmental age) Does the patient have difficulty walking or climbing stairs?: No Weakness of Legs: None Weakness of Arms/Hands: None  Home Assistive Devices/Equipment Home Assistive Devices/Equipment: None    Abuse/Neglect Assessment (Assessment to be complete while patient is alone) Abuse/Neglect Assessment Can Be Completed: Unable to assess, patient is non-responsive or altered mental status     Advance Directives (For Healthcare) Does Patient Have a Medical Advance Directive?: No Would patient like information on creating a medical advance directive?: No - Patient declined    Additional Information 1:1 In Past 12 Months?: No CIRT Risk: No Elopement Risk: No Does patient have medical clearance?: Yes     Disposition: TTS consulted with Nira Conn, NP who recommends inpt treatment. EDP Melene Plan, DO and pt's nurse Darel Hong, RN have been advised of the recommendation.   Disposition Initial Assessment Completed for this Encounter: Yes Disposition of Patient: Admit Type of inpatient treatment program: Adult(per Nira Conn, NP) Patient refused recommended treatment: No  On Site Evaluation by:   Reviewed with Physician:    Karolee Ohs 04/11/2017 9:48 PM

## 2017-04-11 NOTE — ED Notes (Signed)
TTS assessment in progress. 

## 2017-04-11 NOTE — Progress Notes (Signed)
[facility]C [facility].  [name], MSW, LCSW Therapeutic Triage Specialist  [phone]

## 2017-04-11 NOTE — Progress Notes (Signed)
[name] RN, pt will need new vitals and UDS to be reviewed [facility]. Pt's nurse Darel HongJudy, RN has been advised.  Princess BruinsAquicha Rasa Haynes, MSW, LCSW Therapeutic Triage Specialist  [phone]

## 2017-04-11 NOTE — ED Triage Notes (Signed)
Pt brought in by [employer] under IVC d/t psychosis.  Pt answers questions short responses.  Pt questioned if he knew where he was, pt's response "a [place]."

## 2017-04-11 NOTE — ED Notes (Signed)
Pt provided with water to drink and a specimen cup for needed sample, re-assessing VS

## 2017-04-12 ENCOUNTER — Inpatient Hospital Stay (HOSPITAL_COMMUNITY)
Admission: AD | Admit: 2017-04-12 | Discharge: 2017-04-19 | DRG: 885 | Disposition: A | Discharge status: Home / Self Care | Payer: No Typology Code available for payment source | Source: Intra-hospital | Attending: Psychiatry | Admitting: Psychiatry

## 2017-04-12 ENCOUNTER — Encounter (HOSPITAL_COMMUNITY): Payer: Self-pay

## 2017-04-12 DIAGNOSIS — F419 Anxiety disorder, unspecified: Secondary | ICD-10-CM | POA: Diagnosis present

## 2017-04-12 DIAGNOSIS — F333 Major depressive disorder, recurrent, severe with psychotic symptoms: Secondary | ICD-10-CM | POA: Diagnosis present

## 2017-04-12 DIAGNOSIS — Z87891 Personal history of nicotine dependence: Secondary | ICD-10-CM

## 2017-04-12 DIAGNOSIS — Z79899 Other long term (current) drug therapy: Secondary | ICD-10-CM | POA: Diagnosis not present

## 2017-04-12 DIAGNOSIS — G4700 Insomnia, unspecified: Secondary | ICD-10-CM | POA: Diagnosis present

## 2017-04-12 DIAGNOSIS — F23 Brief psychotic disorder: Secondary | ICD-10-CM | POA: Diagnosis present

## 2017-04-12 DIAGNOSIS — F209 Schizophrenia, unspecified: Secondary | ICD-10-CM | POA: Diagnosis present

## 2017-04-12 DIAGNOSIS — F251 Schizoaffective disorder, depressive type: Secondary | ICD-10-CM | POA: Diagnosis not present

## 2017-04-12 LAB — RAPID URINE DRUG SCREEN, HOSP PERFORMED
AMPHETAMINES: NOT DETECTED
BENZODIAZEPINES: NOT DETECTED
Barbiturates: NOT DETECTED
COCAINE: NOT DETECTED
Opiates: NOT DETECTED
TETRAHYDROCANNABINOL: NOT DETECTED

## 2017-04-12 MED ORDER — ALUM & MAG HYDROXIDE-SIMETH 200-200-20 MG/5ML PO SUSP: 30.0000 mL | ORAL | Status: DC | PRN

## 2017-04-12 MED ORDER — BENZTROPINE MESYLATE 0.5 MG PO TABS
0.5000 mg | ORAL_TABLET | Freq: Two times a day (BID) | ORAL | Status: DC
  Filled 2017-04-12 (×2): qty 1

## 2017-04-12 MED ORDER — TRAZODONE HCL 50 MG PO TABS
50.0000 mg | ORAL_TABLET | Freq: Every evening | ORAL | Status: DC | PRN
  Administered 2017-04-13 – 2017-04-14 (×2): 50 mg via ORAL
  Filled 2017-04-12 (×2): qty 1

## 2017-04-12 MED ORDER — ZIPRASIDONE MESYLATE 20 MG IM SOLR: 20.0000 mg | INTRAMUSCULAR | Status: DC | PRN

## 2017-04-12 MED ORDER — LORAZEPAM 1 MG PO TABS
1.0000 mg | ORAL_TABLET | ORAL | Status: AC | PRN
  Administered 2017-04-12: 1 mg via ORAL
  Filled 2017-04-12: qty 1

## 2017-04-12 MED ORDER — RISPERIDONE 1 MG PO TABS: 1.0000 mg | ORAL_TABLET | Freq: Two times a day (BID) | ORAL | Status: DC

## 2017-04-12 MED ORDER — MAGNESIUM HYDROXIDE 400 MG/5ML PO SUSP: 30.0000 mL | Freq: Every day | ORAL | Status: DC | PRN

## 2017-04-12 MED ORDER — BENZTROPINE MESYLATE 0.5 MG PO TABS: 0.5000 mg | ORAL_TABLET | Freq: Two times a day (BID) | ORAL | Status: DC | PRN

## 2017-04-12 MED ORDER — PALIPERIDONE ER 6 MG PO TB24
6.0000 mg | ORAL_TABLET | Freq: Every day | ORAL | Status: DC
  Administered 2017-04-12 – 2017-04-15 (×4): 6 mg via ORAL
  Filled 2017-04-12 (×6): qty 1

## 2017-04-12 MED ORDER — ACETAMINOPHEN 325 MG PO TABS
650.0000 mg | ORAL_TABLET | Freq: Four times a day (QID) | ORAL | Status: DC | PRN
  Administered 2017-04-14 – 2017-04-18 (×5): 650 mg via ORAL
  Filled 2017-04-12 (×6): qty 2

## 2017-04-12 MED ORDER — HYDROXYZINE HCL 25 MG PO TABS
25.0000 mg | ORAL_TABLET | Freq: Three times a day (TID) | ORAL | Status: DC | PRN
  Administered 2017-04-12 – 2017-04-15 (×4): 25 mg via ORAL
  Filled 2017-04-12 (×4): qty 1

## 2017-04-12 MED ORDER — OLANZAPINE 10 MG PO TBDP
10.0000 mg | ORAL_TABLET | Freq: Three times a day (TID) | ORAL | Status: DC | PRN
  Administered 2017-04-12 – 2017-04-14 (×3): 10 mg via ORAL
  Filled 2017-04-12 (×3): qty 1

## 2017-04-12 NOTE — ED Notes (Signed)
[facility] on [facility] per MD order. Personal property given to [employer] for transfer. Pt ambulatory off unit in police custody.

## 2017-04-12 NOTE — [facility] Suicide Risk Assessment (Signed)
[facility] INPATIENT:  Family/Significant Other Suicide Prevention Education  Suicide Prevention Education:  Education Completed; [name] [phone] (Mother) has been identified by the patient as the family member/significant other with whom the patient will be residing, and identified as the person(s) who will aid the patient in the event of a mental health crisis (suicidal ideations/suicide attempt).  With written consent from the patient, the family member/significant other has been provided the following suicide prevention education, prior to the and/or following the discharge of the patient.  The suicide prevention education provided includes the following:  Suicide risk factors  Suicide prevention and interventions  National Suicide Hotline telephone number  [facility] assessment telephone number  [facility] Emergency Assistance 911  [facility] and/or Residential Mobile Crisis Unit telephone number  Request made of family/significant other to:  Remove weapons (e.g., guns, rifles, knives), all items previously/currently identified as safety concern.    Remove drugs/medications (over-the-counter, prescriptions, illicit drugs), all items previously/currently identified as a safety concern.  The family member/significant other verbalizes understanding of the suicide prevention education information provided.  The family member/significant other agrees to remove the items of safety concern listed above.  [name] states that [name] symptoms have been getting worse each day and states that "it is like his mind is everywhere and in the past".  She reports that he has not slept in two days and has not been taking his medication.  He is however going to all of his outpatient appointments at [facility].  She also states that [name] is not working and lost his previous job due to an outburst at work.  She is supporting him.  She shared  that there has been no previous SI or attempts and there are no weapons in the home.    [name] 04/12/2017, 2:30 PM
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
## 2017-04-12 NOTE — Progress Notes (Signed)
Patient ID: [name], male   DOB: April 22, 1990, 27 y.o.   MRN: [id number] Patient admitted to the unit due to increased bizarre behaviors and HI towards mother.  Patient was noted to be responding to internal stimuli during assessment and talking to unseen other.  Patient made several inappropriate statements to male staff during the assessment.  Patient was also noted to be malodorous and has kept poor hygiene.  Patient skin assessment complete and patient found to be free of all signs of injury.  Patient admitted to the unit without incident.

## 2017-04-12 NOTE — Progress Notes (Signed)
Recreation Therapy Notes  INPATIENT RECREATION THERAPY ASSESSMENT  Patient Details Name: [name] MRN: [id number] DOB: 1990/01/21 Today's Date: 04/12/2017       Information Obtained From: Patient   Able to Participate in Assessment/Interview: No   Patient Presentation: Hyberverbal  Reason for Admission (Per Patient): Recreation Therapy Intern was not able to complete assessment due to pt. not being able to focus. Will complete assesment at a later date.    Sheryle Hailarian Corinthian Mizrahi, Recreation Therapy Intern   Sheryle HailDarian Chrisoula Zegarra 04/12/2017, 2:39 PM

## 2017-04-12 NOTE — [facility] Group Notes (Signed)
B[facility] LCSW Group Therapy  04/12/2017  1:05 PM  Type of Therapy:  Group therapy  Participation Level:  Active  Participation Quality:  Attentive  Affect:  Flat  Cognitive:  Oriented  Insight:  Limited  Engagement in Therapy:  Limited  Modes of Intervention:  Discussion, Socialization  Summary of Progress/Problems:  Chaplain was here to lead a group on themes of hope and courage. Burst in late. Talking, interrupting.  Was able to sit and listen when directed.  Dsiagreed with a statement that the leader made [delusion] and left.  Agitated.  [name], [name] 04/12/2017 1:25 PM

## 2017-04-12 NOTE — [facility] Suicide Risk Assessment (Signed)
[facility]   Nursing information obtained from:    Demographic factors:    Current Mental Status:    Loss Factors:    Historical Factors:    Risk Reduction Factors:     Total Time spent with patient: 1 hour Principal Problem: Schizophrenia, acute (HCC) Diagnosis:   Patient Active Problem List   Diagnosis Date Noted  . Schizophrenia, acute (HCC) [F23] 04/12/2017  . Psychosis (HCC) [F29] 01/17/2017  . 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 Screen for STD (sexually transmitted disease) [Z11.3] 09/14/2016  . Hyperesthesia [R20.3] 09/14/2016  . Testicular pain [N50.819] 09/14/2016  . Burning with urination [R30.0] 09/14/2016  . Left ear pain [H92.02] 09/14/2016  . Muscle strain [T14.8XXA] 11/09/2015  . Strain of back [S39.012A] 11/09/2015  . Back spasm [M62.830] 11/09/2015   Subjective Data: See H&P for details  Continued Clinical Symptoms:    The "Alcohol Use Disorders Identification Test", Guidelines for Use in Primary Care, Second Edition.  World Science writerHealth Organization [facility]WHO). Score between 0-7:  no or low risk or alcohol related problems. Score between 8-15:  moderate risk of alcohol related problems. Score between 16-19:  high risk of alcohol related problems. Score 20 or above:  warrants further diagnostic evaluation for alcohol dependence and treatment.   CLINICAL FACTORS:   Schizophrenia:   Less than 25 years old  Psychiatric Specialty Exam: Physical Exam  Nursing note and vitals reviewed.   ROS- See H&p for details  There were no vitals taken for this visit.There is no height or weight on file to calculate BMI.   COGNITIVE FEATURES THAT CONTRIBUTE TO RISK:  None    SUICIDE RISK:   Minimal: No identifiable suicidal ideation.  Patients presenting with no risk factors but with morbid ruminations; may be classified as minimal risk based on the severity of the depressive symptoms  PLAN  OF CARE: See H&P for details  I certify that inpatient services furnished can reasonably be expected to improve the patient's condition.   Micheal Likenshristopher T Amato [place], MD 04/12/2017, 12:37 PM

## 2017-04-12 NOTE — Tx Team (Signed)
Initial Treatment Plan 04/12/2017 6:36 PM [name] ZOX:[id number]    PATIENT STRESSORS: Medication change or noncompliance   PATIENT STRENGTHS: Communication skills   PATIENT IDENTIFIED PROBLEMS: Patient engaged in bizarre behaviors and disorganized.                      DISCHARGE CRITERIA:  Reduction of life-threatening or endangering symptoms to within safe limits  PRELIMINARY DISCHARGE PLAN: Return to previous living arrangement  PATIENT/FAMILY INVOLVEMENT: This treatment plan has been presented to and reviewed with the patient, [name], and/or family member,he patient and family have been given the opportunity to ask questions and make suggestions.  Gene BushyLaRonica R Taisa Deloria, RN 04/12/2017, 6:36 PM
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
## 2017-04-12 NOTE — Progress Notes (Signed)
Recreation Therapy Notes  Date: 4.5.19 Time: 10:00 a.m.  Location: [address] Dayroom   Group Topic: Self-Expression   Goal Area(s) Addresses:  -Patient will participate in group discussions  -Patient will express their thoughts/feelings through art  -Patient will report feeling calm after group tx.   Intervention: Art   Activity: Patients had the opportunity to express their thoughts or feelings through the use of art. Patients painted a canvas using the paint provided.   Education: Self-Expression, Software engineerCoping Skill   Education Outcome:Acknowledges Education  Clinical Observations/Feedback: Patient did not attend   Sheryle HailDarian Nate Common, Recreation Therapy Intern   Sheryle HailDarian Philomena Buttermore 04/12/2017 11:43 AM

## 2017-04-13 LAB — TSH: TSH: 1.068 u[IU]/mL (ref 0.350–4.500)

## 2017-04-13 LAB — HEMOGLOBIN A1C
Hgb A1c MFr Bld: 5.5 % (ref 4.8–5.6)
Mean Plasma Glucose: 111.15 mg/dL

## 2017-04-13 LAB — LIPID PANEL
CHOLESTEROL: 199 mg/dL (ref 0–200)
HDL: 45 mg/dL (ref >40)
LDL CALC: 140 mg/dL — AB (ref 0–99)
Total CHOL/HDL Ratio: 4.4 RATIO
Triglycerides: 71 mg/dL (ref <150)
VLDL: 14 mg/dL (ref 0–40)

## 2017-04-13 MED ORDER — LORAZEPAM 1 MG PO TABS
1.0000 mg | ORAL_TABLET | Freq: Four times a day (QID) | ORAL | Status: DC | PRN
  Administered 2017-04-14 – 2017-04-15 (×3): 1 mg via ORAL
  Filled 2017-04-13 (×3): qty 1

## 2017-04-13 MED ORDER — LORAZEPAM 2 MG/ML IJ SOLN: 1.0000 mg | Freq: Four times a day (QID) | INTRAMUSCULAR | Status: DC | PRN

## 2017-04-13 NOTE — Progress Notes (Signed)
[facility] MD Progress Note  04/13/2017 5:42 PM [name]  MRN:  [id number]  Subjective: Patient seen resting in day room interacting with peers.  Presents with agitation and irritability.  Patient is disorganized and paranoid with questioning.  Chart review discussed with staff patient is taking medication with much encouragement.  Denies medication side effects. Patient seen pacing the unit responding to internal stimuli. NP added agitation protocol see chart for medications. Patient was last inpatient 01/2017 for psychosis and bizarre behavior. Staff to continue to monitor for safety. support and encouragement and reassurance was provided.   History: Per assessment note- [name] is a 27 y/o M with history of treatment for psychosis who was admitted from [facility] on IVC placed by his mother due to worsening symptoms of psychosis including disorganization, paranoia, delusions, and agitation. Pt has delusion that a microchip has been placed in the back of his head. Pt has unpredictable behaviors, tangential responses, and poor insight. He has been roaming around his neighborhood. He has poor adherence to his outpatient treatment regimen. He has recent relevant history of discharge from [facility] on 01/23/23 when he was being treated for similar concerns.  Principal Problem: Schizophrenia, acute (HCC) Diagnosis:   Patient Active Problem List   Diagnosis Date Noted  . Schizophrenia, acute (HCC) [F23] 04/12/2017  . Psychosis (HCC) [F29] 01/17/2017  . 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 Screen for STD (sexually transmitted disease) [Z11.3] 09/14/2016  . Hyperesthesia [R20.3] 09/14/2016  . Testicular pain [N50.819] 09/14/2016  . Burning with urination [R30.0] 09/14/2016  . Left ear pain [H92.02] 09/14/2016  . Muscle strain [T14.8XXA] 11/09/2015  . Strain of back [S39.012A] 11/09/2015  . Back spasm [M62.830] 11/09/2015   Total Time spent with  patient: 30 minutes  Past Psychiatric History: see H&P  Past Medical History:  Past Medical History:  Diagnosis Date  . Allergic rhinitis    mostly spring  . Depression   . Strep throat 04/2013   obervation in ED due to high fever, illness    Past Surgical History:  Procedure Laterality Date  . ORCHIOPEXY     bilat, in childhood   Family History:  Family History  Problem Relation Age of Onset  . Other Mother        "tumor of stomach"  . Anemia Mother   . Other Father        unknown  . Diabetes Brother        borderline  . Hypertension Maternal Grandmother   . Cancer Neg Hx   . Stroke Neg Hx   . Heart disease Neg Hx   . Aneurysm Neg Hx    Family Psychiatric  History: see H&P Social History:  Social History   Substance and Sexual Activity  Alcohol Use No     Social History   Substance and Sexual Activity  Drug Use Yes  . Types: Marijuana    Social History   Socioeconomic History  . Marital status: Single    Spouse name: Not on file  . Number of children: Not on file  . Years of education: Not on file  . Highest education level: Not on file  Occupational History  . Not on file  Social Needs  . Financial resource strain: Not on file  . Food insecurity:    Worry: Not on file    Inability: Not on file  . Transportation needs:    Medical: Not on file  Non-medical: Not on file  Tobacco Use  . Smoking status: Former Games developer  . Smokeless tobacco: Never Used  Substance and Sexual Activity  . Alcohol use: No  . Drug use: Yes    Types: Marijuana  . Sexual activity: Yes    Birth control/protection: None  Lifestyle  . Physical activity:    Days per week: Not on file    Minutes per session: Not on file  . Stress: Not on file  Relationships  . Social connections:    Talks on phone: Not on file    Gets together: Not on file    Attends religious service: Not on file    Active member of club or organization: Not on file    Attends meetings of clubs or  organizations: Not on file    Relationship status: Not on file  Other Topics Concern  . 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   Lives with parents, from [place], high school at [place], [place] for a while, dropped out.  Wants to go into HVAC work.  Exercise - plays basketball regularly.  Currently trying to find work to [employer] to return to school.   Prior incarceration x 2mo for theft.     Additional Social History:                         Sleep: Good  Appetite:  Good  Current Medications: Current Facility-Administered Medications  Medication Dose Route Frequency Provider Last Rate Last Dose  . acetaminophen (TYLENOL) tablet 650 mg  650 mg Oral Q6H PRN Nira Conn A, NP      . alum & mag hydroxide-simeth (MAALOX/MYLANTA) 200-200-20 MG/5ML suspension 30 mL  30 mL Oral Q4H PRN Nira Conn A, NP      . benztropine (COGENTIN) tablet 0.5 mg  0.5 mg Oral BID PRN Micheal Likens, MD      . hydrOXYzine (ATARAX/VISTARIL) tablet 25 mg  25 mg Oral TID PRN Nira Conn A, NP   25 mg at 04/12/17 1355  . LORazepam (ATIVAN) tablet 1 mg  1 mg Oral Q6H PRN Oneta Rack, NP       Or  . LORazepam (ATIVAN) injection 1 mg  1 mg Intramuscular Q6H PRN Oneta Rack, NP      . magnesium hydroxide (MILK OF MAGNESIA) suspension 30 mL  30 mL Oral Daily PRN Nira Conn A, NP      . OLANZapine zydis (ZYPREXA) disintegrating tablet 10 mg  10 mg Oral Q8H PRN Micheal Likens, MD   10 mg at 04/13/17 0747   And  . ziprasidone (GEODON) injection 20 mg  20 mg Intramuscular PRN Micheal Likens, MD      . paliperidone (INVEGA) 24 hr tablet 6 mg  6 mg Oral Daily Micheal Likens, MD   6 mg at 04/13/17 0747  . traZODone (DESYREL) tablet 50 mg  50 mg Oral QHS PRN Jackelyn Poling, NP        Lab Results:  Results for orders placed or performed during the hospital encounter of 04/12/17 (from the  past 48 hour(s))  Hemoglobin A1c     Status: None   Collection Time: 04/13/17  6:34 AM  Result Value Ref Range   Hgb A1c MFr Bld 5.5 4.8 - 5.6 %    Comment: (NOTE) Pre diabetes:  5.7%-6.4% Diabetes:              >6.4% Glycemic control for   <7.0% adults with diabetes    Mean Plasma Glucose 111.15 mg/dL    Comment: Performed at [facility], [address], [place], [place] [address]  Lipid panel     Status: Abnormal   Collection Time: 04/13/17  6:34 AM  Result Value Ref Range   Cholesterol 199 0 - 200 mg/dL   Triglycerides 71 <604 mg/dL   HDL 45 >54 mg/dL   Total CHOL/HDL Ratio 4.4 RATIO   VLDL 14 0 - 40 mg/dL   LDL Cholesterol 098 (H) 0 - 99 mg/dL    Comment:        Total Cholesterol/HDL:CHD Risk Coronary Heart Disease Risk Table                     Men   Women  1/2 Average Risk   3.4   3.3  Average Risk       5.0   4.4  2 X Average Risk   9.6   7.1  3 X Average Risk  23.4   11.0        Use the calculated Patient Ratio above and the CHD Risk Table to determine the patient's CHD Risk.        ATP III CLASSIFICATION (LDL):  <100     mg/dL   Optimal  119-147  mg/dL   Near or Above                    Optimal  130-159  mg/dL   Borderline  829-562  mg/dL   High  >130     mg/dL   Very High Performed at [facility], [address], [place], [place] [address]   TSH     Status: None   Collection Time: 04/13/17  6:34 AM  Result Value Ref Range   TSH 1.068 0.350 - 4.500 uIU/mL    Comment: Performed by a 3rd Generation assay with a functional sensitivity of <=0.01 uIU/mL. Performed at [facility], [address] [place], [place] [address]     Blood Alcohol level:  Lab Results  Component Value Date   ETH <10 04/11/2017   ETH <10 01/16/2017    Metabolic Disorder Labs: Lab Results  Component Value Date   HGBA1C 5.5 04/13/2017   MPG 111.15 04/13/2017   No results found for: PROLACTIN Lab Results  Component Value  Date   CHOL 199 04/13/2017   TRIG 71 04/13/2017   HDL 45 04/13/2017   CHOLHDL 4.4 04/13/2017   VLDL 14 04/13/2017   LDLCALC 140 (H) 04/13/2017   LDLCALC 98 09/14/2016    Physical Findings: AIMS: Facial and Oral Movements Muscles of Facial Expression: None, normal Lips and Perioral Area: None, normal Jaw: None, normal Tongue: None, normal,Extremity Movements Upper (arms, wrists, hands, fingers): None, normal Lower (legs, knees, ankles, toes): None, normal, Trunk Movements Neck, shoulders, hips: None, normal, Overall Severity Severity of abnormal movements (highest score from questions above): None, normal Incapacitation due to abnormal movements: None, normal Patient's awareness of abnormal movements (rate only patient's report): No Awareness, Dental Status Current problems with teeth and/or dentures?: No Does patient usually wear dentures?: No  CIWA:    COWS:     Musculoskeletal: Strength & Muscle Tone: within normal limits Gait & Station: normal Patient leans: N/A  Psychiatric Specialty Exam: Physical Exam  Nursing note and vitals reviewed. Constitutional: He is oriented to person, place, and time. He appears well-developed.  Cardiovascular: Normal rate.  Neurological: He is alert and oriented to person, place, and time.  Psychiatric: He has a normal mood and affect. His behavior is normal.    Review of Systems  Psychiatric/Behavioral: Negative for depression, hallucinations and suicidal ideas. The patient is not nervous/anxious.   All other systems reviewed and are negative.   Blood pressure 125/70, pulse 96, temperature 97.8 F (36.6 C), temperature source Oral, resp. rate 18, height 6\' 1"  (1.854 m), weight 90.7 kg (200 lb), SpO2 100 %.Body mass index is 26.39 kg/m.  General Appearance: Casual and Fairly Groomed  Eye Contact:  Good  Speech:  Clear and Coherent and Normal Rate  Volume:  Normal  Mood:  Euthymic  Affect:  Appropriate and Congruent  Thought  Process:  Coherent and Goal Directed  Orientation:  Full (Time, Place, and Person)  Thought Content:  Delusions and Ideas of Reference:   Delusions  Suicidal Thoughts:  No  Homicidal Thoughts:  No  Memory:  Immediate;   Fair Recent;   Fair Remote;   Fair  Judgement:  Fair  Insight:  Lacking  Psychomotor Activity:  Normal  Concentration:  Concentration: Fair  Recall:  FiservFair  Fund of Knowledge:  Fair  Language:  Fair  Akathisia:  No  Handed:    AIMS (if indicated):     Assets:  Manufacturing systems engineerCommunication Skills Physical Health Resilience Social Support  ADL's:  Intact  Cognition:  WNL  Sleep:  Number of Hours: 6.75     Treatment Plan Summary: Daily contact with patient to assess and evaluate symptoms and progress in treatment and Medication management.  - Continue current treatment plan on 04/13/2017 except where noted   Schizophrenia   -Continue Invega 6 mg Po QHS  -EPS  - continue cogentin  0.5mg   PRN BID  - Anxiety - Continue atarax q8h prn anxiety  - Agitation  Protocol   See chart   - Insomnia - Continue trazodone 50mg  po qhs prn insomnia  -Encourage participation in groups and the therapeutic milieu -Discharge planning will be ongoing  Oneta Rackanika N Lewis, NP 04/13/2017, 5:42 PM

## 2017-04-13 NOTE — Progress Notes (Signed)
Nursing Progress Note [phone] [id number]  Data: Patient presents with labile/irritable mood. Patient is seen up in the milieu at times. Patient appears watchful and paranoid. Patient asks Air traffic controllerwriter and nursing student, "are you scared?" Patient does not always answer assessment questions by RN. Patient frequently requests water and addresses writer "thank you ma'am". Patient did take morning medications but later refused PRN medications for anxiety/agitation. Patient denies SI/HI/AVH or pain. Patient contracts for safety on the unit at this time. Patient provided but unable to adequately complete their self-inventory sheet. Patient denies concerns for Clinical research associatewriter.   Action: Patient educated about and provided medication per provider's orders. Patient safety maintained with q15 min safety checks. Low fall risk precautions in place. Emotional support given. 1:1 interaction and active listening provided. Patient encouraged to attend meals and groups. Labs, vital signs and patient behavior monitored throughout shift. Patient encouraged to work on treatment plan and goals.  Response: Patient remains safe on the unit at this time. Will continue to support and monitor.

## 2017-04-13 NOTE — Progress Notes (Signed)
Patient ID: [name], male   DOB: [birth year], 27 y.o.   MRN: [id number] Pt remain in bed resting with eyes closed. Pt does not look to be in any distress at this time. Will continue to monitor for safety.

## 2017-04-13 NOTE — Progress Notes (Signed)
Psychoeducational Group Note  Date:  04/13/2017 Time:  1440  Group Topic/Focus:  Managing Feelings:   The focus of this group is to identify what feelings patients have difficulty handling and develop a plan to handle them in a healthier way upon discharge.  Participation Level: Did Not Attend  Participation Quality:  Not Applicable  Affect:  Not Applicable  Cognitive:  Not Applicable  Insight:  Not Applicable  Engagement in Group: Not Applicable  Additional Comments:  Pt refused to attend group this morning.  Aleigh Grunden E 04/13/2017, 2:40 PM

## 2017-04-13 NOTE — Plan of Care (Signed)
Problem: Safety: Goal: Periods of time without injury will increase Intervention: Patient contracts for safety on the unit. Low fall risk precautions in place. Safety monitored with q15 minute checks. Outcome: Patient remains safe on the unit at this time. 04/13/2017 2:30 PM - Progressing by Ferrel Loganollazo, Siris Hoos A, RN

## 2017-04-13 NOTE — Progress Notes (Signed)
Adult Psychoeducational Group Note  Date:  04/13/2017 Time:  4:00 PM  Group Topic/Focus: SMART Goals Goals Group: The focus of this group is to teach patients how to make goals that are specific, measurable, attainable, relative, and timely. Each patient is instructed to practice setting a goal using the SMART tool in order to achieve success during treatment and discuss how the patient can incorporate goal setting into their daily lives to aide in recovery.  Participation Level:  Did not attend  Participation Quality:  Not Applicable  Affect:  Not Applicable  Cognitive:  Not Applicable  Insight: Not Applicable  Engagement in Group:  Did not attend  Modes of Intervention:  Discussion, Education, Socialization and Support  Additional Comments: Patient left as group was beginning.  Rae Lipsmanda A Cliffard Hair 04/13/2017, 5:45

## 2017-04-13 NOTE — Progress Notes (Signed)
D: Pt denies SI/HI. Pt has been cooperative. Patient voices no concerns.  A: Pt was offered support and encouragement. Pt was encourage to attend groups. Q 15 minute checks were done for safety.  R:Pt attends groups and interacts well with peers and staff.  Pt has no complaints.Pt receptive to treatment and safety maintained on unit.

## 2017-04-14 DIAGNOSIS — F23 Brief psychotic disorder: Secondary | ICD-10-CM

## 2017-04-14 LAB — PROLACTIN: PROLACTIN: 25.2 ng/mL — AB (ref 4.0–15.2)

## 2017-04-14 MED ORDER — PALIPERIDONE ER 3 MG PO TB24
3.0000 mg | ORAL_TABLET | Freq: Every day | ORAL | Status: DC
  Administered 2017-04-14 – 2017-04-15 (×2): 3 mg via ORAL
  Filled 2017-04-14 (×4): qty 1

## 2017-04-14 MED ORDER — OLANZAPINE 5 MG PO TBDP: 5.0000 mg | ORAL_TABLET | Freq: Three times a day (TID) | ORAL | Status: DC | PRN

## 2017-04-14 NOTE — Progress Notes (Addendum)
[facility] MD Progress Note  04/14/2017 12:13 PM [name]  MRN:  [id number]  Subjective: Patient seen resting in day room interacting with peers.  Presents with agitation and irritability.  Patient is disorganized and paranoid with questioning.  Chart review discussed with staff patient is taking medication with much encouragement.  Denies medication side effects. Patient seen pacing the unit responding to internal stimuli. NP added agitation protocol see chart for medications. Patient was last inpatient 01/2017 for psychosis and bizarre behavior. Staff to continue to monitor for safety. support and encouragement and reassurance was provided.   History: Per assessment note- [name] is a 27 y/o M with history of treatment for psychosis who was admitted from [facility] on IVC placed by his mother due to worsening symptoms of psychosis including disorganization, paranoia, delusions, and agitation. Pt has delusion that a microchip has been placed in the back of his head. Pt has unpredictable behaviors, tangential responses, and poor insight. He has been roaming around his neighborhood. He has poor adherence to his outpatient treatment regimen. He has recent relevant history of discharge from [facility] on 01/22/17 when he was being treated for similar concerns.  Principal Problem: Schizophrenia, acute (HCC) Diagnosis:   Patient Active Problem List   Diagnosis Date Noted  . Schizophrenia, acute (HCC) [F23] 04/12/2017  . Psychosis (HCC) [F29] 01/17/2017  . 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 Screen for STD (sexually transmitted disease) [Z11.3] 09/14/2016  . Hyperesthesia [R20.3] 09/14/2016  . Testicular pain [N50.819] 09/14/2016  . Burning with urination [R30.0] 09/14/2016  . Left ear pain [H92.02] 09/14/2016  . Muscle strain [T14.8XXA] 11/09/2015  . Strain of back [S39.012A] 11/09/2015  . Back spasm [M62.830] 11/09/2015   Total Time spent with  patient: 30 minutes  Past Psychiatric History: see H&P  Past Medical History:  Past Medical History:  Diagnosis Date  . Allergic rhinitis    mostly spring  . Depression   . Strep throat 04/2013   obervation in ED due to high fever, illness    Past Surgical History:  Procedure Laterality Date  . ORCHIOPEXY     bilat, in childhood   Family History:  Family History  Problem Relation Age of Onset  . Other Mother        "tumor of stomach"  . Anemia Mother   . Other Father        unknown  . Diabetes Brother        borderline  . Hypertension Maternal Grandmother   . Cancer Neg Hx   . Stroke Neg Hx   . Heart disease Neg Hx   . Aneurysm Neg Hx    Family Psychiatric  History: see H&P Social History:  Social History   Substance and Sexual Activity  Alcohol Use No     Social History   Substance and Sexual Activity  Drug Use Yes  . Types: Marijuana    Social History   Socioeconomic History  . Marital status: Single    Spouse name: Not on file  . Number of children: Not on file  . Years of education: Not on file  . Highest education level: Not on file  Occupational History  . Not on file  Social Needs  . Financial resource strain: Not on file  . Food insecurity:    Worry: Not on file    Inability: Not on file  . Transportation needs:    Medical: Not on file  Non-medical: Not on file  Tobacco Use  . Smoking status: Former Games developer  . Smokeless tobacco: Never Used  Substance and Sexual Activity  . Alcohol use: No  . Drug use: Yes    Types: Marijuana  . Sexual activity: Yes    Birth control/protection: None  Lifestyle  . Physical activity:    Days per week: Not on file    Minutes per session: Not on file  . Stress: Not on file  Relationships  . Social connections:    Talks on phone: Not on file    Gets together: Not on file    Attends religious service: Not on file    Active member of club or organization: Not on file    Attends meetings of clubs or  organizations: Not on file    Relationship status: Not on file  Other Topics Concern  . 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   Lives with parents, from [place], high school at [place], [place] for a while, dropped out.  Wants to go into HVAC work.  Exercise - plays basketball regularly.  Currently trying to find work to [employer] to return to school.   Prior incarceration x 67mo for theft.     Additional Social History:                         Sleep: Good  Appetite:  Good  Current Medications: Current Facility-Administered Medications  Medication Dose Route Frequency Provider Last Rate Last Dose  . acetaminophen (TYLENOL) tablet 650 mg  650 mg Oral Q6H PRN Nira Conn A, NP   650 mg at 04/14/17 0846  . alum & mag hydroxide-simeth (MAALOX/MYLANTA) 200-200-20 MG/5ML suspension 30 mL  30 mL Oral Q4H PRN Nira Conn A, NP      . benztropine (COGENTIN) tablet 0.5 mg  0.5 mg Oral BID PRN Micheal Likens, MD      . hydrOXYzine (ATARAX/VISTARIL) tablet 25 mg  25 mg Oral TID PRN Jackelyn Poling, NP   25 mg at 04/13/17 2204  . LORazepam (ATIVAN) tablet 1 mg  1 mg Oral Q6H PRN Oneta Rack, NP   1 mg at 04/14/17 0751   Or  . LORazepam (ATIVAN) injection 1 mg  1 mg Intramuscular Q6H PRN Oneta Rack, NP      . magnesium hydroxide (MILK OF MAGNESIA) suspension 30 mL  30 mL Oral Daily PRN Nira Conn A, NP      . OLANZapine zydis (ZYPREXA) disintegrating tablet 10 mg  10 mg Oral Q8H PRN Micheal Likens, MD   10 mg at 04/14/17 1212   And  . ziprasidone (GEODON) injection 20 mg  20 mg Intramuscular PRN Micheal Likens, MD      . paliperidone (INVEGA) 24 hr tablet 3 mg  3 mg Oral Daily Oneta Rack, NP   3 mg at 04/14/17 1212  . paliperidone (INVEGA) 24 hr tablet 6 mg  6 mg Oral Daily Micheal Likens, MD   6 mg at 04/14/17 0751  . traZODone (DESYREL) tablet 50 mg  50 mg  Oral QHS PRN Jackelyn Poling, NP   50 mg at 04/13/17 2204    Lab Results:  Results for orders placed or performed during the hospital encounter of 04/12/17 (from the past 48 hour(s))  Hemoglobin A1c     Status: None  Collection Time: 04/13/17  6:34 AM  Result Value Ref Range   Hgb A1c MFr Bld 5.5 4.8 - 5.6 %    Comment: (NOTE) Pre diabetes:          5.7%-6.4% Diabetes:              >6.4% Glycemic control for   <7.0% adults with diabetes    Mean Plasma Glucose 111.15 mg/dL    Comment: Performed at [facility], [address] [place], [place] [address]  Lipid panel     Status: Abnormal   Collection Time: 04/13/17  6:34 AM  Result Value Ref Range   Cholesterol 199 0 - 200 mg/dL   Triglycerides 71 <604<150 mg/dL   HDL 45 >54>40 mg/dL   Total CHOL/HDL Ratio 4.4 RATIO   VLDL 14 0 - 40 mg/dL   LDL Cholesterol 098140 (H) 0 - 99 mg/dL    Comment:        Total Cholesterol/HDL:CHD Risk Coronary Heart Disease Risk Table                     Men   Women  1/2 Average Risk   3.4   3.3  Average Risk       5.0   4.4  2 X Average Risk   9.6   7.1  3 X Average Risk  23.4   11.0        Use the calculated Patient Ratio above and the CHD Risk Table to determine the patient's CHD Risk.        ATP III CLASSIFICATION (LDL):  <100     mg/dL   Optimal  [id number]  mg/dL   Near or Above                    Optimal  130-159  mg/dL   Borderline  [id number]  mg/dL   High  >130>190     mg/dL   Very High Performed at [facility], [address] [place], [place] [address]   Prolactin     Status: Abnormal   Collection Time: 04/13/17  6:34 AM  Result Value Ref Range   Prolactin 25.2 (H) 4.0 - 15.2 ng/mL    Comment: (NOTE) Performed At: [facility] [address] [place], [place] [id number] Jolene SchimkeNagendra Sanjai MD UX:[id number]:[phone] Performed at [facility], [address] [place], [place] [address]   TSH     Status: None   Collection Time: 04/13/17  6:34 AM  Result  Value Ref Range   TSH 1.068 0.350 - 4.500 uIU/mL    Comment: Performed by a 3rd Generation assay with a functional sensitivity of <=0.01 uIU/mL. Performed at [facility], [address] [place], [place] [address]     Blood Alcohol level:  Lab Results  Component Value Date   ETH <10 04/11/2017   ETH <10 01/16/2017    Metabolic Disorder Labs: Lab Results  Component Value Date   HGBA1C 5.5 04/13/2017   MPG 111.15 04/13/2017   Lab Results  Component Value Date   PROLACTIN 25.2 (H) 04/13/2017   Lab Results  Component Value Date   CHOL 199 04/13/2017   TRIG 71 04/13/2017   HDL 45 04/13/2017   CHOLHDL 4.4 04/13/2017   VLDL 14 04/13/2017   LDLCALC 140 (H) 04/13/2017   LDLCALC 98 09/14/2016    Physical Findings: AIMS: Facial and Oral Movements Muscles of Facial Expression: None, normal Lips  and Perioral Area: None, normal Jaw: None, normal Tongue: None, normal,Extremity Movements Upper (arms, wrists, hands, fingers): None, normal Lower (legs, knees, ankles, toes): None, normal, Trunk Movements Neck, shoulders, hips: None, normal, Overall Severity Severity of abnormal movements (highest score from questions above): None, normal Incapacitation due to abnormal movements: None, normal Patient's awareness of abnormal movements (rate only patient's report): No Awareness, Dental Status Current problems with teeth and/or dentures?: No Does patient usually wear dentures?: No  CIWA:    COWS:     Musculoskeletal: Strength & Muscle Tone: within normal limits Gait & Station: normal Patient leans: N/A  Psychiatric Specialty Exam: Physical Exam  Nursing note and vitals reviewed. Constitutional: He is oriented to person, place, and time. He appears well-developed.  Cardiovascular: Normal rate.  Neurological: He is alert and oriented to person, place, and time.  Psychiatric: He has a normal mood and affect. His behavior is normal.    Review of Systems   Psychiatric/Behavioral: Negative for depression, hallucinations and suicidal ideas. The patient is not nervous/anxious.   All other systems reviewed and are negative.   Blood pressure 137/85, pulse 92, temperature (!) 97.5 F (36.4 C), temperature source Oral, resp. rate 18, height 6\' 1"  (1.854 m), weight 90.7 kg (200 lb), SpO2 100 %.Body mass index is 26.39 kg/m.  General Appearance: Casual and Fairly Groomed  Eye Contact:  Good  Speech:  Clear and Coherent and Normal Rate  Volume:  Normal  Mood:  Euthymic  Affect:  Appropriate and Congruent  Thought Process:  Coherent and Goal Directed  Orientation:  Full (Time, Place, and Person)  Thought Content:  Delusions and Ideas of Reference:   Delusions  Suicidal Thoughts:  No  Homicidal Thoughts:  No  Memory:  Immediate;   Fair Recent;   Fair Remote;   Fair  Judgement:  Fair  Insight:  Lacking  Psychomotor Activity:  Normal  Concentration:  Concentration: Fair  Recall:  [employer] of Knowledge:  Fair  Language:  Fair  Akathisia:  No  Handed:    AIMS (if indicated):     Assets:  Manufacturing systems engineer Physical Health Resilience Social Support  ADL's:  Intact  Cognition:  WNL  Sleep:  Number of Hours: 6.75     Treatment Plan Summary: Daily contact with patient to assess and evaluate symptoms and progress in treatment and Medication management.  - Continue current treatment plan on 04/14/2017 except where noted   Schizophrenia   -Continue Invega 6 mg Po QHS  -EPS  - continue cogentin  0.5mg   PRN BID  - Anxiety - Continue atarax q8h prn anxiety  - Agitation  Protocol   See chart   - Insomnia - Continue trazodone 50mg  po qhs prn insomnia  -Encourage participation in groups and the therapeutic milieu -Discharge planning will be ongoing  Oneta Rack, NP 04/14/2017, 12:13 PM Agree with NP Progress Note

## 2017-04-14 NOTE — Progress Notes (Signed)
Adult Psychoeducational Group Note  Date:  04/14/2017 Time:  1600  Group Topic/Focus: Patients were asked to answer various questions at random and discuss the topics with the group. Healthy Interaction: The focus of this group is to practice communication, appropriate interaction with peers, as well as healthy discussion.  Participation Level:  Active  Participation Quality:  Monopolizing and Redirectable  Affect:  Flat and Irritable  Cognitive:  Disorganized and Lacking  Insight: Lacking and Limited  Engagement in Group:  Lacking, Limited and Monopolizing  Modes of Intervention:  Activity, Discussion, Socialization and Support  Additional Comments:  Patient was disorganized and did not answer questions appropriately. Patient was intrusive when peers were talking and required redirection by staff. Patient was confrontational with his peers at times.  Rae Lipsmanda A Ricco Dershem 04/14/2017, 3:23 PM

## 2017-04-14 NOTE — Plan of Care (Signed)
  Problem: Health Behavior/Discharge Planning: Goal: Compliance with treatment plan for underlying cause of condition will improve Intervention: Patient encouraged to take medications as prescribed and attend groups. Patient encouraged to be active in their recovery. Outcome: Patient is attending groups and taking medications as prescribed. 04/14/2017 12:39 PM - Progressing by Ferrel Loganollazo, Cristan Hout A, RN     Problem: Safety: Goal: Periods of time without injury will increase Intervention: Patient contracts for safety on the unit. Low fall risk precautions in place. Safety monitored with q15 minute checks. Outcome: Patient remains safe on the unit at this time. 04/14/2017 12:39 PM - Progressing by Ferrel Loganollazo, Naketa Daddario A, RN

## 2017-04-14 NOTE — Plan of Care (Signed)
D: Pt denies SH/ VH, +ve SI / AH- contracts for safety. Pt is pleasant and cooperative. Pt continues to believe illuminati placed a chip in his head. Pt expressed having migraines and needing a tylenol for HA 0 / 10 . Pt visible on the unit most of the evening.   A: Pt was offered support and encouragement. Pt was given scheduled medications. Pt was encourage to attend groups. Q 15 minute checks were done for safety.   R:Pt attends groups and interacts well with peers and staff. Pt is taking medication. Pt receptive to treatment and safety maintained on unit.    Problem: Education: Goal: Mental status will improve Outcome: Progressing   Problem: Coping: Goal: Ability to demonstrate self-control will improve Outcome: Progressing   Problem: Safety: Goal: Periods of time without injury will increase Outcome: Progressing   Problem: Education: Goal: Will be free of psychotic symptoms Outcome: Not Progressing   Problem: Coping: Goal: Coping ability will improve Outcome: Progressing   Problem: Safety: Goal: Ability to redirect hostility and anger into socially appropriate behaviors will improve Outcome: Progressing

## 2017-04-14 NOTE — Progress Notes (Addendum)
[facility] Progress [facility]  04/14/2017 4:20 PM [name]  MRN:  [id number]  Evaluations:  Patient seen pacing the unit responding to internal stimuli.  Continues to present with disorganization and paranoid thoughts.  Reported that patient was very irritable after a phone call with his mother this morning.  Reports his mom has  "microchip in his brain".  Patient continues to inquire if people are scared and or afraid of him. Invega  increase to 9 mg.  Orders placed for EKG. NP adjusted agitation protocol, pending EKG reading.  Support and encouragement and reassurance was provided.   History: Per assessment note- [name] is a 27 y/o M with history of treatment for psychosis who was admitted from [facility] on IVC placed by his mother due to worsening symptoms of psychosis including disorganization, paranoia, delusions, and agitation. Pt has delusion that a microchip has been placed in the back of his head. Pt has unpredictable behaviors, tangential responses, and poor insight. He has been roaming around his neighborhood. He has poor adherence to his outpatient treatment regimen. He has [facility] when he was being treated for similar concerns.  Principal Problem: Schizophrenia, acute (HCC) Diagnosis:   Patient Active Problem List   Diagnosis Date Noted  . Schizophrenia, acute (HCC) [F23] 04/12/2017  . Psychosis (HCC) [F29] 01/17/2017  . 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 Screen for STD (sexually transmitted disease) [Z11.3] 09/14/2016  . Hyperesthesia [R20.3] 09/14/2016  . Testicular pain [N50.819] 09/14/2016  . Burning with urination [R30.0] 09/14/2016  . Left ear pain [H92.02] 09/14/2016  . Muscle strain [T14.8XXA] 11/09/2015  . Strain of back [S39.012A] 11/09/2015  . Back spasm [M62.830] 11/09/2015   Total Time spent with patient: 30 minutes  Past Psychiatric History: see  H&P  Past Medical History:  Past Medical History:  Diagnosis Date  . Allergic rhinitis    mostly spring  . Depression   . Strep throat 04/2013   obervation in ED due to high fever, illness    Past Surgical History:  Procedure Laterality Date  . ORCHIOPEXY     bilat, in childhood   Family History:  Family History  Problem Relation Age of Onset  . Other Mother        "tumor of stomach"  . Anemia Mother   . Other Father        unknown  . Diabetes Brother        borderline  . Hypertension Maternal Grandmother   . Cancer Neg Hx   . Stroke Neg Hx   . Heart disease Neg Hx   . Aneurysm Neg Hx    Family Psychiatric  History: see H&P Social History:  Social History   Substance and Sexual Activity  Alcohol Use No     Social History   Substance and Sexual Activity  Drug Use Yes  . Types: Marijuana    Social History   Socioeconomic History  . Marital status: Single    Spouse name: Not on file  . Number of children: Not on file  . Years of education: Not on file  . Highest education level: Not on file  Occupational History  . Not on file  Social Needs  . Financial resource strain: Not on file  . Food insecurity:    Worry: Not on file    Inability: Not on file  . Transportation needs:    Medical: Not on file  Non-medical: Not on file  Tobacco Use  . Smoking status: Former Games developer  . Smokeless tobacco: Never Used  Substance and Sexual Activity  . Alcohol use: No  . Drug use: Yes    Types: Marijuana  . Sexual activity: Yes    Birth control/protection: None  Lifestyle  . Physical activity:    Days per week: Not on file    Minutes per session: Not on file  . Stress: Not on file  Relationships  . Social connections:    Talks on phone: Not on file    Gets together: Not on file    Attends religious service: Not on file    Active member of club or organization: Not on file    Attends meetings of clubs or organizations: Not on file    Relationship status:  Not on file  Other Topics Concern  . 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   Lives with parents, from [place], high school at [employer], [place] for a while, dropped out.  Wants to go into HVAC work.  Exercise - plays basketball regularly.  Currently trying to find work to [employer] to return to school.   Prior incarceration x 11mo for theft.     Additional Social History:                         Sleep: Good  Appetite:  Good  Current Medications: Current Facility-Administered Medications  Medication Dose Route Frequency Provider Last Rate Last Dose  . acetaminophen (TYLENOL) tablet 650 mg  650 mg Oral Q6H PRN Nira Conn A, NP   650 mg at 04/14/17 0846  . alum & mag hydroxide-simeth (MAALOX/MYLANTA) 200-200-20 MG/5ML suspension 30 mL  30 mL Oral Q4H PRN Nira Conn A, NP      . benztropine (COGENTIN) tablet 0.5 mg  0.5 mg Oral BID PRN Micheal Likens, MD      . hydrOXYzine (ATARAX/VISTARIL) tablet 25 mg  25 mg Oral TID PRN Jackelyn Poling, NP   25 mg at 04/13/17 2204  . LORazepam (ATIVAN) tablet 1 mg  1 mg Oral Q6H PRN Oneta Rack, NP   1 mg at 04/14/17 0751   Or  . LORazepam (ATIVAN) injection 1 mg  1 mg Intramuscular Q6H PRN Oneta Rack, NP      . magnesium hydroxide (MILK OF MAGNESIA) suspension 30 mL  30 mL Oral Daily PRN Nira Conn A, NP      . OLANZapine zydis (ZYPREXA) disintegrating tablet 10 mg  10 mg Oral Q8H PRN Micheal Likens, MD   10 mg at 04/14/17 1212   And  . ziprasidone (GEODON) injection 20 mg  20 mg Intramuscular PRN Micheal Likens, MD      . paliperidone (INVEGA) 24 hr tablet 3 mg  3 mg Oral Daily Oneta Rack, NP   3 mg at 04/14/17 1212  . paliperidone (INVEGA) 24 hr tablet 6 mg  6 mg Oral Daily Micheal Likens, MD   6 mg at 04/14/17 0751  . traZODone (DESYREL) tablet 50 mg  50 mg Oral QHS PRN Jackelyn Poling, NP   50 mg at 04/13/17  2204    Lab Results:  Results for orders placed or performed during the hospital encounter of 04/12/17 (from the past 48 hour(s))  Hemoglobin A1c     Status: None  Collection Time: 04/13/17  6:34 AM  Result Value Ref Range   Hgb A1c MFr Bld 5.5 4.8 - 5.6 %    Comment: (NOTE) Pre diabetes:          5.7%-6.4% Diabetes:              >6.4% Glycemic control for   <7.0% adults with diabetes    Mean Plasma Glucose 111.15 mg/dL    Comment: Performed at [facility], [address] [place], [place] [address]  Lipid panel     Status: Abnormal   Collection Time: 04/13/17  6:34 AM  Result Value Ref Range   Cholesterol 199 0 - 200 mg/dL   Triglycerides 71 <604 mg/dL   HDL 45 >54 mg/dL   Total CHOL/HDL Ratio 4.4 RATIO   VLDL 14 0 - 40 mg/dL   LDL Cholesterol 098 (H) 0 - 99 mg/dL    Comment:        Total Cholesterol/HDL:CHD Risk Coronary Heart Disease Risk Table                     Men   Women  1/2 Average Risk   3.4   3.3  Average Risk       5.0   4.4  2 X Average Risk   9.6   7.1  3 X Average Risk  23.4   11.0        Use the calculated Patient Ratio above and the CHD Risk Table to determine the patient's CHD Risk.        ATP III CLASSIFICATION (LDL):  <100     mg/dL   Optimal  119-147  mg/dL   Near or Above                    Optimal  130-159  mg/dL   Borderline  829-562  mg/dL   High  >130     mg/dL   Very High Performed at [facility], [address], [place], [place] [address]   Prolactin     Status: Abnormal   Collection Time: 04/13/17  6:34 AM  Result Value Ref Range   Prolactin 25.2 (H) 4.0 - 15.2 ng/mL    Comment: (NOTE) Performed At: [facility] [address] [place], [place] [id number] Jolene Schimke MD UX:[id number] Performed at [facility], [address], [place], [place] [address]   TSH     Status: None   Collection Time: 04/13/17  6:34 AM  Result Value Ref Range   TSH 1.068 0.350 - 4.500 uIU/mL     Comment: Performed by a 3rd Generation assay with a functional sensitivity of <=0.01 uIU/mL. Performed at [facility], [address] [place], [place] [address]     Blood Alcohol level:  Lab Results  Component Value Date   ETH <10 04/11/2017   ETH <10 01/16/2017    Metabolic Disorder Labs: Lab Results  Component Value Date   HGBA1C 5.5 04/13/2017   MPG 111.15 04/13/2017   Lab Results  Component Value Date   PROLACTIN 25.2 (H) 04/13/2017   Lab Results  Component Value Date   CHOL 199 04/13/2017   TRIG 71 04/13/2017   HDL 45 04/13/2017   CHOLHDL 4.4 04/13/2017   VLDL 14 04/13/2017   LDLCALC 140 (H) 04/13/2017   LDLCALC 98 09/14/2016    Physical Findings: AIMS: Facial and Oral Movements Muscles of Facial Expression: None, normal Lips  and Perioral Area: None, normal Jaw: None, normal Tongue: None, normal,Extremity Movements Upper (arms, wrists, hands, fingers): None, normal Lower (legs, knees, ankles, toes): None, normal, Trunk Movements Neck, shoulders, hips: None, normal, Overall Severity Severity of abnormal movements (highest score from questions above): None, normal Incapacitation due to abnormal movements: None, normal Patient's awareness of abnormal movements (rate only patient's report): No Awareness, Dental Status Current problems with teeth and/or dentures?: No Does patient usually wear dentures?: No  CIWA:    COWS:     Musculoskeletal: Strength & Muscle Tone: within normal limits Gait & Station: normal Patient leans: N/A  Psychiatric Specialty Exam: Physical Exam  Nursing note and vitals reviewed. Constitutional: He appears well-developed.  Cardiovascular: Normal rate.  Neurological: He is alert.  Psychiatric: He has a normal mood and affect. His behavior is normal.    Review of Systems  Psychiatric/Behavioral: Negative for depression, hallucinations and suicidal ideas. The patient is not nervous/anxious.   All other  systems reviewed and are negative.   Blood pressure 137/85, pulse 92, temperature (!) 97.5 F (36.4 C), temperature source Oral, resp. rate 18, height 6\' 1"  (1.854 m), weight 90.7 kg (200 lb), SpO2 100 %.Body mass index is 26.39 kg/m.  General Appearance: Casual and Fairly Groomed  Eye Contact:  Good  Speech:  Clear and Coherent and Normal Rate  Volume:  Normal  Mood:  Euthymic  Affect:  Appropriate and Congruent  Thought Process:  Coherent and Goal Directed  Orientation:  Full (Time, Place, and Person)  Thought Content:  Delusions and Ideas of Reference:   Delusions  Suicidal Thoughts:  No  Homicidal Thoughts:  No  Memory:  Immediate;   Fair Recent;   Fair Remote;   Fair  Judgement:  Fair  Insight:  Lacking  Psychomotor Activity:  Normal  Concentration:  Concentration: Fair  Recall:  [employer] of Knowledge:  Fair  Language:  Fair  Akathisia:  No  Handed:    AIMS (if indicated):     Assets:  Manufacturing systems engineer Physical Health Resilience Social Support  ADL's:  Intact  Cognition:  WNL  Sleep:  Number of Hours: 6.75     Treatment Plan Summary: Daily contact with patient to assess and evaluate symptoms and progress in treatment and Medication management.  - Continue current treatment plan on 04/14/2017 except where noted   Schizophrenia   -Increased Invega 6 mg to 9 mg po QHS  -EPS  - continue cogentin  0.5mg   PRN BID  - Anxiety - Continue atarax q8h prn anxiety  - Agitation  Protocol   See chart   - Insomnia - Continue trazodone 50mg  po qhs prn insomnia  -Encourage participation in groups and the therapeutic milieu -Discharge planning will be ongoing  Oneta Rack, NP 04/14/2017, 4:20 PM Agree with NP Progress Note

## 2017-04-14 NOTE — Progress Notes (Signed)
Nursing Progress Note [id number]  Data: Patient presents with hyperactivity and is religiously preoccupied. Patient is seen reading out of the bible to his peers. Patient is intrusive at times requiring redirection with staff. Patient irritable this morning but has been compliant with medications. Patient frequently interacting in the dayroom. Patient's thoughts appear disorganized with flight of ideas and paranoia. Patient reports passive SI per his self-inventory sheet but denies HI/AVH. Patient contracts for safety on the unit at this time. Patient completed self-inventory sheet and rate depression, hopelessness, and anxiety 1,2,7 respectively.   Action: Patient educated about and provided medication per provider's orders. Patient safety maintained with q15 min safety checks. Low fall risk precautions in place. Emotional support given. 1:1 interaction and active listening provided. Patient encouraged to attend meals and groups. Labs, vital signs and patient behavior monitored throughout shift. Patient encouraged to work on treatment plan and goals.  Response: Patient remains safe on the unit at this time. Will continue to support and monitor.

## 2017-04-14 NOTE — [facility] Group Notes (Signed)
[facility] LCSW Group Therapy Note  Date/Time: 04/14/17, 1100  Type of Therapy/Topic:  Group Therapy:  Feelings about Diagnosis  Participation Level:  Active   Mood: intrusive   Description of Group:    This group will allow patients to explore their thoughts and feelings about diagnoses they have received. Patients will be guided to explore their level of understanding and acceptance of these diagnoses. Facilitator will encourage patients to process their thoughts and feelings about the reactions of others to their diagnosis, and will guide patients in identifying ways to discuss their diagnosis with significant others in their lives. This group will be process-oriented, with patients participating in exploration of their own experiences as well as giving and receiving support and challenge from other group members.   Therapeutic Goals: 1. Patient will demonstrate understanding of diagnosis as evidence by identifying two or more symptoms of the disorder:  2. Patient will be able to express two feelings regarding the diagnosis 3. Patient will demonstrate ability to communicate their needs through discussion and/or role plays  Summary of Patient Progress:Pt was unable to participate appropriately in the group.  Pt initially told CSW a wrong name before finally identifying himself.  Pt spent the entire group giving unrelated answers to questions brought up in the group.  "Diagnosis is a dilemmna."  Is one example.  Pt interrupted other group members frequently and needed regular redirection by CSW.        Therapeutic Modalities:   Cognitive Behavioral Therapy Brief Therapy Feelings Identification   Daleen SquibbGreg Seren Chaloux, LCSW

## 2017-04-15 MED ORDER — PALIPERIDONE ER 3 MG PO TB24
9.0000 mg | ORAL_TABLET | Freq: Every day | ORAL | Status: DC
  Administered 2017-04-16 – 2017-04-19 (×4): 9 mg via ORAL
  Filled 2017-04-15 (×6): qty 3

## 2017-04-15 MED ORDER — PALIPERIDONE PALMITATE 234 MG/1.5ML IM SUSP
234.0000 mg | Freq: Once | INTRAMUSCULAR | Status: AC
  Administered 2017-04-15: 234 mg via INTRAMUSCULAR
  Filled 2017-04-15: qty 1.5

## 2017-04-15 MED ORDER — TRAZODONE HCL 100 MG PO TABS
100.0000 mg | ORAL_TABLET | Freq: Every evening | ORAL | Status: DC | PRN
  Administered 2017-04-15 – 2017-04-18 (×3): 100 mg via ORAL
  Filled 2017-04-15 (×3): qty 1

## 2017-04-15 MED ORDER — PALIPERIDONE PALMITATE 156 MG/ML IM SUSP
156.0000 mg | INTRAMUSCULAR | Status: DC
  Administered 2017-04-19: 156 mg via INTRAMUSCULAR
  Filled 2017-04-15: qty 1

## 2017-04-15 NOTE — Progress Notes (Signed)
Recreation Therapy Notes  INPATIENT RECREATION THERAPY ASSESSMENT  Patient Details Name: [name] MRN: [id number] DOB: 07/05/1990 Today's Date: 04/15/2017       Information Obtained From: Chart Review  Able to Participate in Assessment/Interview: No  Patient was hyperverbal during assessment. Pt. Appeared not being able to focus. Patient was not able to stay on topic.   Patient Presentation: Hyperverbal  Reason for Admission (Per Patient): Active Symptoms  "[name] is a 27 y/o M with history of treatment for psychosis who was admitted from [facility] on IVC placed by his mother due to worsening symptoms of psychosis including disorganization, paranoia, delusions, and agitation. Pt has delusion that a microchip has been placed in the back of his head. Pt has unpredictable behaviors, tangential responses, and poor insight. He has been roaming around his neighborhood. He has poor adherence to his outpatient treatment regimen. He has [facility] when he was being treated for similar concerns. Pt was started on oral form of Invega, and dose has been titrated up during his stay. He has demonstrated ongoing symptoms of psychosis."   Current SI (including self-harm):  No  Current HI:  No  Current AVH: No  Staff Intervention Plan: Group Attendance, Collaborate with Interdisciplinary Treatment Team  Consent to Intern Participation: N/A   Sheryle Hailarian Brandan Robicheaux, Recreation Therapy Intern   Sheryle HailDarian Chauntelle Azpeitia 04/15/2017, 3:53 PM

## 2017-04-15 NOTE — [facility] Group Notes (Signed)
LCSW Group Therapy Note   04/15/2017 1:15pm   Type of Therapy and Topic:  Group Therapy:  Overcoming Obstacles   Participation Level:  Minimal   Description of Group:    In this group patients will be encouraged to explore what they see as obstacles to their own wellness and recovery. They will be guided to discuss their thoughts, feelings, and behaviors related to these obstacles. The group will process together ways to cope with barriers, with attention given to specific choices patients can make. Each patient will be challenged to identify changes they are motivated to make in order to overcome their obstacles. This group will be process-oriented, with patients participating in exploration of their own experiences as well as giving and receiving support and challenge from other group members.   Therapeutic Goals: 1. Patient will identify personal and current obstacles as they relate to admission. 2. Patient will identify barriers that currently interfere with their wellness or overcoming obstacles.  3. Patient will identify feelings, thought process and behaviors related to these barriers. 4. Patient will identify two changes they are willing to make to overcome these obstacles:      Summary of Patient Progress  In and out multiple times.  Hard time sitting still when present.  Up getting coffee, playing with a puzzle, moving from chair to chair.  Contributions made little sense.  Today was focused on "[name]," "mother nature" "abortions."    Therapeutic Modalities:   Cognitive Behavioral Therapy Solution Focused Therapy Motivational Interviewing Relapse Prevention Therapy  Gene RogueRodney B Axavier Pressley, LCSW 04/15/2017 3:06 PM

## 2017-04-15 NOTE — Progress Notes (Addendum)
D: Pt SI/ AH- contracts for safety. Pt is pleasant and cooperative. Pt continues to be bizarre , disorganized with flight of ideas. " I was talking to the grass". Pt seen in the dayroom interacting with peers some of the evening. Pt stated he had poor sleep, per PA his Trazodone was increased from 50 mg to 100 mg  A: Pt was offered support and encouragement. Pt was given scheduled medications. Pt was encourage to attend groups. Q 15 minute checks were done for safety.   R:Pt attends groups and interacts well with peers and staff. Pt is taking medication. Pt has no complaints at this time.Pt receptive to treatment and safety maintained on unit.

## 2017-04-15 NOTE — Progress Notes (Signed)
DAR NOTE: Patient presents with anxious affect and mood.  Denies suicidal thoughts, pain, auditory and visual hallucinations.  Described energy level as hyper and concentration as good.  Rates depression at 0, hopelessness at 0, and anxiety at 0.  Maintained on routine safety checks.  Medications given as prescribed.  Support and encouragement offered as needed.  Attended group and participated.  States goal for today is "discharge."  Patient observed socializing with peers in the dayroom.  Received Gean BirchwoodInvega Sustenna injection.  No adverse reaction noted or reported.  Offered no complaint.

## 2017-04-15 NOTE — Progress Notes (Signed)
Recreation Therapy Notes   Date: 4.8.19 Time: 10:00 a.m. Location: [address] Dayroom   Group Topic: Healthy Support Systems   Goal Area(s) Addresses:  Goal 1.1: To increase awareness of a healthy support system - Patient will identify the importance of a healthy support system - Patient will identify their own support system  - Patient will identify ways on how to improve their support system Goal 2.1: To improve communication  - Patient will participate in opening discussion  - Patient will communicate with peers during team building activity  - Patient will participate in final discussion   Behavioral Response: Passive    Intervention: STEM  Activity: Straw Tower: Patients must construct a tower using twenty-five straws and masking tape. Recreation Therapy Intern tested the stability of their tower by placing a book on top of their tower. Afterwards, Recreation Therapy Intern begun processing with group about building a healthy support system, and how communication is needed throughout the process.   Education: Database administratorHealthy Support System, Communication   Education Outcome: Acknowledges Education  Clinical Observations/Feedback: Patient attended group but did not participate during group activity.   Sheryle Hailarian Jyles Sontag, Recreation Therapy Intern   Sheryle HailDarian Tyshawn Ciullo 04/15/2017 11:33 AM

## 2017-04-15 NOTE — Tx Team (Signed)
Interdisciplinary Treatment and Diagnostic Plan Update  04/15/2017 Time of Session: 4:30 PM  MUADH CREASY MRN: [id number]  Principal Diagnosis: Schizophrenia, acute  [facility])  Secondary Diagnoses: Principal Problem:   Schizophrenia, acute ([place])   Current Medications:  Current Facility-Administered Medications  Medication Dose Route Frequency Provider Last Rate Last Dose  . acetaminophen (TYLENOL) tablet 650 mg  650 mg Oral Q6H PRN Lindon Romp A, NP   650 mg at 04/14/17 2206  . alum & mag hydroxide-simeth (MAALOX/MYLANTA) 200-200-20 MG/5ML suspension 30 mL  30 mL Oral Q4H PRN Lindon Romp A, NP      . benztropine (COGENTIN) tablet 0.5 mg  0.5 mg Oral BID PRN Pennelope Bracken, MD      . hydrOXYzine (ATARAX/VISTARIL) tablet 25 mg  25 mg Oral TID PRN Rozetta Nunnery, NP   25 mg at 04/14/17 2207  . LORazepam (ATIVAN) tablet 1 mg  1 mg Oral Q6H PRN Derrill Center, NP   1 mg at 04/14/17 2206   Or  . LORazepam (ATIVAN) injection 1 mg  1 mg Intramuscular Q6H PRN Derrill Center, NP      . magnesium hydroxide (MILK OF MAGNESIA) suspension 30 mL  30 mL Oral Daily PRN Lindon Romp A, NP      . OLANZapine zydis (ZYPREXA) disintegrating tablet 5 mg  5 mg Oral Q8H PRN Derrill Center, NP      . Derrill Memo ON 04/19/2017] paliperidone (INVEGA SUSTENNA) injection 156 mg  156 mg Intramuscular Q30 days Pennelope Bracken, MD      . Derrill Memo ON 04/16/2017] paliperidone (INVEGA) 24 hr tablet 9 mg  9 mg Oral Daily Pennelope Bracken, MD      . traZODone (DESYREL) tablet 50 mg  50 mg Oral QHS PRN Rozetta Nunnery, NP   50 mg at 04/14/17 2207    PTA Medications: Medications Prior to Admission  Medication Sig Dispense Refill Last Dose  . benztropine (COGENTIN) 0.5 MG tablet Take 1 tablet (0.5 mg total) by mouth 2 (two) times daily. For prevention of drug induced tremors 60 tablet 0   . hydrOXYzine (ATARAX/VISTARIL) 25 MG tablet Take 1 tablet (25 mg total) by mouth 3 (three) times daily as needed for  anxiety. 60 tablet 0   . risperiDONE (RISPERDAL) 1 MG tablet Take 1 tablet (1 mg) by mouth in the morning & 2 tablets (2 mg) at bedtime: For mood control 90 tablet 0   . traZODone (DESYREL) 50 MG tablet Take 1 tablet (50 mg total) by mouth at bedtime as needed for sleep. 30 tablet 0     Patient Stressors: Medication change or noncompliance  Patient Strengths: Communication skills  Treatment Modalities: Medication Management, Group therapy, Case management,  1 to 1 session with clinician, Psychoeducation, Recreational therapy.   Physician Treatment Plan for Primary Diagnosis: Schizophrenia, acute ([place]) Long Term Goal(s): Improvement in symptoms so as ready for discharge  Short Term Goals: Compliance with prescribed medications will improve Ability to identify and develop effective coping behaviors will improve  Medication Management: Evaluate patient's response, side effects, and tolerance of medication regimen.  Therapeutic Interventions: 1 to 1 sessions, Unit Group sessions and Medication administration.  Evaluation of Outcomes: Progressing  Physician Treatment Plan for Secondary Diagnosis: Principal Problem:   Schizophrenia, acute ([place])   Long Term Goal(s): Improvement in symptoms so as ready for discharge  Short Term Goals: Compliance with prescribed medications will improve Ability to identify and develop effective coping behaviors will improve  Medication Management: Evaluate patient's response, side effects, and tolerance of medication regimen.  Therapeutic Interventions: 1 to 1 sessions, Unit Group sessions and Medication administration.  Evaluation of Outcomes: Progressing   RN Treatment Plan for Primary Diagnosis: Schizophrenia, acute ([place]) Long Term Goal(s): Knowledge of disease and therapeutic regimen to maintain health will improve  Short Term Goals: Ability to identify and develop effective coping behaviors will improve and Compliance with prescribed  medications will improve  Medication Management: RN will administer medications as ordered by provider, will assess and evaluate patient's response and provide education to patient for prescribed medication. RN will report any adverse and/or side effects to prescribing provider.  Therapeutic Interventions: 1 on 1 counseling sessions, Psychoeducation, Medication administration, Evaluate responses to treatment, Monitor vital signs and CBGs as ordered, Perform/monitor CIWA, COWS, AIMS and Fall Risk screenings as ordered, Perform wound care treatments as ordered.  Evaluation of Outcomes: Progressing   LCSW Treatment Plan for Primary Diagnosis: Schizophrenia, acute ([place]) Long Term Goal(s): Safe transition to appropriate next level of care at discharge, Engage patient in therapeutic group addressing interpersonal concerns.  Short Term Goals: Engage patient in aftercare planning with referrals and resources  Therapeutic Interventions: Assess for all discharge needs, 1 to 1 time with Social worker, Explore available resources and support systems, Assess for adequacy in community support network, Educate family and significant other(s) on suicide prevention, Complete Psychosocial Assessment, Interpersonal group therapy.  Evaluation of Outcomes: Met  Return home, follow up Neuropsychiatric   Progress in Treatment: Attending groups: Yes Participating in groups: Yes Taking medication as prescribed: Yes Toleration medication: Yes, no side effects reported at this time Family/Significant other contact made:  Patient understands diagnosis: Yes AEB Discussing patient identified problems/goals with staff: Yes Medical problems stabilized or resolved: Yes Denies suicidal/homicidal ideation: Yes Issues/concerns per patient self-inventory: None Other: N/A  New problem(s) identified: None identified at this time.   New Short Term/Long Term Goal(s): "I will take Tylenol even though I am on a vegan  diet."  Discharge Plan or Barriers:   Reason for Continuation of Hospitalization: Disorganization Mood instability Medication stabilization   Estimated Length of Stay: 4/12  Attendees: Patient: [name] 04/15/2017  4:30 PM  Physician: Maris Berger, MD 04/15/2017  4:30 PM  Nursing: Sena Hitch, RN 04/15/2017  4:30 PM  RN Care Manager: Lars Pinks, RN 04/15/2017  4:30 PM  Social Worker: Ripley Fraise 04/15/2017  4:30 PM  Recreational Therapist: Winfield Cunas 04/15/2017  4:30 PM  Other: Norberto Sorenson 04/15/2017  4:30 PM  Other:  04/15/2017  4:30 PM    Scribe for Treatment Team:  Roque Lias LCSW 04/15/2017 4:30 PM

## 2017-04-15 NOTE — Progress Notes (Signed)
[facility] MD Progress Note  04/15/2017 1:49 PM [name]  MRN:  [id number] Subjective:     [name] is a 27 y/o M with history of treatment for psychosis who was admitted from [facility] on IVC placed by his mother due to worsening symptoms of psychosis including disorganization, paranoia, delusions, and agitation. Pt has delusion that a microchip has been placed in the back of his head. Pt has unpredictable behaviors, tangential responses, and poor insight. He has been roaming around his neighborhood. He has poor adherence to his outpatient treatment regimen. He has recent relevant history of discharge from [facility] on 01/22/17 when he was being treated for similar concerns. Pt was started on oral form of Invega, and dose has been titrated up during his stay. He has demonstrated ongoing symptoms of psychosis.  Today upon evaluation, pt shares, "I'm fine." He has no specific concerns aside from asking when he will be discharged. Pt is generally disorganized and mildly agitated during interview. He denies physical complaints. He denies SI/HI/AH/VH, but RN staff have noted that he appears to respond to internal stimuli. Pt remains delusional and tangential; pt was asked about delusional content of microchip in his head, and he replies, "I"m still waiting to go vegan." Pt was asked if his medications have been helpful and he replies, "Yes, it's been great." Pt was asked about what he noticed has been helped by the [place], and he replies, "It just does what it does." Pt initially states that he would just like to be on tylenol, but discussed with patient that Hinda Glatter (or another equivalent medication) will need to be continued outside the hospital. Pt is in agreement to continue [place] and he is in agreement to transition to long-acting injectable form of [place]. Pt had no further questions, comments, or concerns.  Principal Problem: Schizophrenia, acute (HCC) Diagnosis:   Patient Active Problem List    Diagnosis Date Noted  . Schizophrenia, acute (HCC) [F23] 04/12/2017  . Psychosis (HCC) [F29] 01/17/2017  . 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 Screen for STD (sexually transmitted disease) [Z11.3] 09/14/2016  . Hyperesthesia [R20.3] 09/14/2016  . Testicular pain [N50.819] 09/14/2016  . Burning with urination [R30.0] 09/14/2016  . Left ear pain [H92.02] 09/14/2016  . Muscle strain [T14.8XXA] 11/09/2015  . Strain of back [S39.012A] 11/09/2015  . Back spasm [M62.830] 11/09/2015   Total Time spent with patient: 30 minutes  Past Psychiatric History: See H&P  Past Medical History:  Past Medical History:  Diagnosis Date  . Allergic rhinitis    mostly spring  . Depression   . Strep throat 04/2013   obervation in ED due to high fever, illness    Past Surgical History:  Procedure Laterality Date  . ORCHIOPEXY     bilat, in childhood   Family History:  Family History  Problem Relation Age of Onset  . Other Mother        "tumor of stomach"  . Anemia Mother   . Other Father        unknown  . Diabetes Brother        borderline  . Hypertension Maternal Grandmother   . Cancer Neg Hx   . Stroke Neg Hx   . Heart disease Neg Hx   . Aneurysm Neg Hx    Family Psychiatric  History: see H&P Social History:  Social History   Substance and Sexual Activity  Alcohol Use No     Social History  Substance and Sexual Activity  Drug Use Yes  . Types: Marijuana    Social History   Socioeconomic History  . Marital status: Single    Spouse name: Not on file  . Number of children: Not on file  . Years of education: Not on file  . Highest education level: Not on file  Occupational History  . Not on file  Social Needs  . Financial resource strain: Not on file  . Food insecurity:    Worry: Not on file    Inability: Not on file  . Transportation needs:    Medical: Not on file    Non-medical: Not on file  Tobacco Use  .  Smoking status: Former Games developer  . Smokeless tobacco: Never Used  Substance and Sexual Activity  . Alcohol use: No  . Drug use: Yes    Types: Marijuana  . Sexual activity: Yes    Birth control/protection: None  Lifestyle  . Physical activity:    Days per week: Not on file    Minutes per session: Not on file  . Stress: Not on file  Relationships  . Social connections:    Talks on phone: Not on file    Gets together: Not on file    Attends religious service: Not on file    Active member of club or organization: Not on file    Attends meetings of clubs or organizations: Not on file    Relationship status: Not on file  Other Topics Concern  . 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   Lives with parents, from [place], high school at [employer], [place] for a while, dropped out.  Wants to go into HVAC work.  Exercise - plays basketball regularly.  Currently trying to find work to [employer] to return to school.   Prior incarceration x 37mo for theft.     Additional Social History:                         Sleep: Fair  Appetite:  Good  Current Medications: Current Facility-Administered Medications  Medication Dose Route Frequency Provider Last Rate Last Dose  . acetaminophen (TYLENOL) tablet 650 mg  650 mg Oral Q6H PRN Nira Conn A, NP   650 mg at 04/14/17 2206  . alum & mag hydroxide-simeth (MAALOX/MYLANTA) 200-200-20 MG/5ML suspension 30 mL  30 mL Oral Q4H PRN Nira Conn A, NP      . benztropine (COGENTIN) tablet 0.5 mg  0.5 mg Oral BID PRN Micheal Likens, MD      . hydrOXYzine (ATARAX/VISTARIL) tablet 25 mg  25 mg Oral TID PRN Jackelyn Poling, NP   25 mg at 04/14/17 2207  . LORazepam (ATIVAN) tablet 1 mg  1 mg Oral Q6H PRN Oneta Rack, NP   1 mg at 04/14/17 2206   Or  . LORazepam (ATIVAN) injection 1 mg  1 mg Intramuscular Q6H PRN Oneta Rack, NP      . magnesium hydroxide (MILK OF  MAGNESIA) suspension 30 mL  30 mL Oral Daily PRN Nira Conn A, NP      . OLANZapine zydis (ZYPREXA) disintegrating tablet 5 mg  5 mg Oral Q8H PRN Oneta Rack, NP      . paliperidone (INVEGA SUSTENNA) injection 234 mg  234 mg Intramuscular Once Micheal Likens, MD  Followed by  . [START ON 04/19/2017] paliperidone (INVEGA SUSTENNA) injection 156 mg  156 mg Intramuscular Q30 days Micheal Likensainville, Wilbur Oakland T, MD      . Melene Muller[START ON 04/16/2017] paliperidone (INVEGA) 24 hr tablet 9 mg  9 mg Oral Daily Micheal Likens[place], Enrika Aguado T, MD      . traZODone (DESYREL) tablet 50 mg  50 mg Oral QHS PRN Jackelyn PolingBerry, Jason A, NP   50 mg at 04/14/17 2207    Lab Results: No results found for this or any previous visit (from the past 48 hour(s)).  Blood Alcohol level:  Lab Results  Component Value Date   ETH <10 04/11/2017   ETH <10 01/16/2017    Metabolic Disorder Labs: Lab Results  Component Value Date   HGBA1C 5.5 04/13/2017   MPG 111.15 04/13/2017   Lab Results  Component Value Date   PROLACTIN 25.2 (H) 04/13/2017   Lab Results  Component Value Date   CHOL 199 04/13/2017   TRIG 71 04/13/2017   HDL 45 04/13/2017   CHOLHDL 4.4 04/13/2017   VLDL 14 04/13/2017   LDLCALC 140 (H) 04/13/2017   LDLCALC 98 09/14/2016    Physical Findings: AIMS: Facial and Oral Movements Muscles of Facial Expression: None, normal Lips and Perioral Area: None, normal Jaw: None, normal Tongue: None, normal,Extremity Movements Upper (arms, wrists, hands, fingers): None, normal Lower (legs, knees, ankles, toes): None, normal, Trunk Movements Neck, shoulders, hips: None, normal, Overall Severity Severity of abnormal movements (highest score from questions above): None, normal Incapacitation due to abnormal movements: None, normal Patient's awareness of abnormal movements (rate only patient's report): No Awareness, Dental Status Current problems with teeth and/or dentures?: No Does patient usually wear  dentures?: No  CIWA:    COWS:     Musculoskeletal: Strength & Muscle Tone: within normal limits Gait & Station: normal Patient leans: N/A  Psychiatric Specialty Exam: Physical Exam  Nursing note and vitals reviewed.   Review of Systems  Constitutional: Negative for chills and fever.  Respiratory: Negative for cough and shortness of breath.   Cardiovascular: Negative for chest pain.  Gastrointestinal: Negative for abdominal pain, heartburn, nausea and vomiting.  Psychiatric/Behavioral: Positive for hallucinations. Negative for depression and suicidal ideas. The patient is not nervous/anxious and does not have insomnia.     Blood pressure 139/64, pulse (!) 127, temperature 97.7 F (36.5 C), temperature source Oral, resp. rate 18, height 6\' 1"  (1.854 m), weight 90.7 kg (200 lb), SpO2 100 %.Body mass index is 26.39 kg/m.  General Appearance: Casual  Eye Contact:  Fair  Speech:  Clear and Coherent and Normal Rate  Volume:  Normal  Mood:  Angry, Anxious and Irritable  Affect:  Blunt, Congruent and Labile  Thought Process:  Disorganized and Descriptions of Associations: Loose  Orientation:  Full (Time, Place, and Person)  Thought Content:  Delusions, Hallucinations: Auditory, Ideas of Reference:   Paranoia Delusions and Tangential  Suicidal Thoughts:  No  Homicidal Thoughts:  No  Memory:  Immediate;   Fair Recent;   Fair Remote;   Fair  Judgement:  Poor  Insight:  Lacking  Psychomotor Activity:  Normal  Concentration:  Concentration: Fair  Recall:  FiservFair  Fund of Knowledge:  Fair  Language:  Poor  Akathisia:  No  Handed:    AIMS (if indicated):     Assets:  Manufacturing systems engineerCommunication Skills Physical Health Resilience Social Support  ADL's:  Intact  Cognition:  WNL  Sleep:  Number of Hours: 6.25   Treatment Plan Summary:  Daily contact with patient to assess and evaluate symptoms and progress in treatment and Medication management   -Continue inpatient  hospitalization  Schizophrenia  -Continue Invega 9 mg po qDay     - Start Hinda Glatter [place] 234mg  IM once followed by [name] 156mg  IM q30 days starting on 04/19/17  -EPS             - continue cogentin  0.5mg   PRN BID   - Anxiety -Continueatarax q8h prn anxiety  - Agitation  Protocol             -Continue zydis 5mg  po q8h prn agitation   - Continue Ativan 1mg  po/IM q6h prn agitation  - Insomnia -Continuetrazodone 50mg  po qhs prn insomnia  -Encourage participation in groups and the therapeutic milieu -Discharge planning will be ongoing  Micheal Likens, MD 04/15/2017, 1:49 PM

## 2017-04-16 MED ORDER — IBUPROFEN 600 MG PO TABS: 600.0000 mg | ORAL_TABLET | Freq: Four times a day (QID) | ORAL | Status: DC | PRN

## 2017-04-16 MED ORDER — QUETIAPINE FUMARATE 50 MG PO TABS
50.0000 mg | ORAL_TABLET | Freq: Every day | ORAL | Status: DC
Start: 2017-04-16 — End: 2017-04-18
  Administered 2017-04-16: 50 mg via ORAL
  Filled 2017-04-16 (×5): qty 1

## 2017-04-16 MED ORDER — HYDROXYZINE HCL 25 MG PO TABS
25.0000 mg | ORAL_TABLET | Freq: Three times a day (TID) | ORAL | Status: DC | PRN
  Administered 2017-04-16: 25 mg via ORAL
  Filled 2017-04-16: qty 1

## 2017-04-16 MED ORDER — OLANZAPINE 5 MG PO TBDP: 5.0000 mg | ORAL_TABLET | Freq: Three times a day (TID) | ORAL | Status: DC | PRN

## 2017-04-16 MED ORDER — LORAZEPAM 1 MG PO TABS: 1.0000 mg | ORAL_TABLET | Freq: Four times a day (QID) | ORAL | Status: DC | PRN

## 2017-04-16 MED ORDER — LORAZEPAM 2 MG/ML IJ SOLN: 1.0000 mg | Freq: Four times a day (QID) | INTRAMUSCULAR | Status: DC | PRN

## 2017-04-16 NOTE — Progress Notes (Signed)
Adult Psychoeducational Group Note  Date:  04/16/2017 Time:  8:51 PM  Group Topic/Focus:  Wrap-Up Group:   The focus of this group is to help patients review their daily goal of treatment and discuss progress on daily workbooks.  Participation Level:  Active  Participation Quality:  Appropriate  Affect:  Appropriate  Cognitive:  Appropriate  Insight: Appropriate  Engagement in Group:  Engaged  Modes of Intervention:  Discussion  Additional Comments: The patient expressed he rates today a 7.The patient also said that he attend groups.  Octavio Mannshigpen, Clydia Nieves Lee 04/16/2017, 8:51 PM

## 2017-04-16 NOTE — Progress Notes (Signed)
DAR NOTE: Patient presents with anxious affect and bizarre behavior.  Frequently coming to the nursing station to ask for different things. Denies suicidal thoughts, auditory and visual hallucinations.  Described energy level as low and concentration as good.  Rates depression at 0, hopelessness at 0, and anxiety at 0.  Maintained on routine safety checks.  Medications given as prescribed.  Support and encouragement offered as needed.  Attended group and participated.  States goal for today is "home."  Patient observed socializing with peers in the dayroom.  Observed pacing the hallway and appears to be preoccupied.

## 2017-04-16 NOTE — Plan of Care (Signed)
  Problem: Activity: Goal: Sleeping patterns will improve Outcome: Progressing   Problem: Coping: Goal: Ability to demonstrate self-control will improve Outcome: Progressing   Problem: Safety: Goal: Periods of time without injury will increase Outcome: Progressing   

## 2017-04-16 NOTE — Progress Notes (Signed)
[facility] MD Progress Note  04/16/2017 11:36 AM [name]  MRN:  [id number] Subjective:   [name] is a 27 y/o M with history of treatment for psychosis who was admitted from [facility] on IVC placed by his mother due to worsening symptoms of psychosis including disorganization, paranoia, delusions, and agitation. Pt has delusion that a microchip has been placed in the back of his head. Pt has unpredictable behaviors, tangential responses, and poor insight. He has been roaming around his neighborhood. He has poor adherence to his outpatient treatment regimen. He has recent relevant history of discharge from [facility] on 01/22/17 when he was being treated for similar concerns. Pt was started on oral form of Invega, and dose has been titrated up during his stay. He has demonstrated ongoing symptoms of psychosis. Yesterday, he was started on long-acting injectable [place].  Today upon evaluation, pt shares, "I'm feeling good now." He denies any specific concerns. He denies physical complaints aside from headache. He denies SI/HI/AH/VH; however, after interview, pt was observed responding to internal stimuli. He denies being concerned about delusion of microchip implanted in his skull, stating, "I don't care about it any more." Pt continues to have some elevated intensity of his affect, but it has improved compared to previous encounters. He remains mildly disorganized in his speech and thought process, and he ruminates about "tylenol" and "veganism" but he seems less preoccupied with these topics as compared to previous interview. He is in agreement to continue his current regimen without changes. He agrees to have SW team contact his mother and begin to coordinate discharge planning. He had no further questions, comments, or concerns.  Principal Problem: Schizophrenia, acute (HCC) Diagnosis:   Patient Active Problem List   Diagnosis Date Noted  . Schizophrenia, acute (HCC) [F23] 04/12/2017  .  Psychosis (HCC) [F29] 01/17/2017  . 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 Screen for STD (sexually transmitted disease) [Z11.3] 09/14/2016  . Hyperesthesia [R20.3] 09/14/2016  . Testicular pain [N50.819] 09/14/2016  . Burning with urination [R30.0] 09/14/2016  . Left ear pain [H92.02] 09/14/2016  . Muscle strain [T14.8XXA] 11/09/2015  . Strain of back [S39.012A] 11/09/2015  . Back spasm [M62.830] 11/09/2015   Total Time spent with patient: 30 minutes  Past Psychiatric History: see H&P  Past Medical History:  Past Medical History:  Diagnosis Date  . Allergic rhinitis    mostly spring  . Depression   . Strep throat 04/2013   obervation in ED due to high fever, illness    Past Surgical History:  Procedure Laterality Date  . ORCHIOPEXY     bilat, in childhood   Family History:  Family History  Problem Relation Age of Onset  . Other Mother        "tumor of stomach"  . Anemia Mother   . Other Father        unknown  . Diabetes Brother        borderline  . Hypertension Maternal Grandmother   . Cancer Neg Hx   . Stroke Neg Hx   . Heart disease Neg Hx   . Aneurysm Neg Hx    Family Psychiatric  History: see H&P Social History:  Social History   Substance and Sexual Activity  Alcohol Use No     Social History   Substance and Sexual Activity  Drug Use Yes  . Types: Marijuana    Social History   Socioeconomic History  . Marital status: Single  Spouse name: Not on file  . Number of children: Not on file  . Years of education: Not on file  . Highest education level: Not on file  Occupational History  . Not on file  Social Needs  . Financial resource strain: Not on file  . Food insecurity:    Worry: Not on file    Inability: Not on file  . Transportation needs:    Medical: Not on file    Non-medical: Not on file  Tobacco Use  . Smoking status: Former Games developermoker  . Smokeless tobacco: Never Used   Substance and Sexual Activity  . Alcohol use: No  . Drug use: Yes    Types: Marijuana  . Sexual activity: Yes    Birth control/protection: None  Lifestyle  . Physical activity:    Days per week: Not on file    Minutes per session: Not on file  . Stress: Not on file  Relationships  . Social connections:    Talks on phone: Not on file    Gets together: Not on file    Attends religious service: Not on file    Active member of club or organization: Not on file    Attends meetings of clubs or organizations: Not on file    Relationship status: Not on file  Other Topics Concern  . 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   Lives with parents, from [place], [place] [employer] at [employer] for a while, dropped out.  Wants to go into HVAC work.  Exercise - plays basketball regularly.  Currently trying to find work to [employer] bill at [employer] to return to school.   Prior incarceration x 4mo for theft.     Additional Social History:                         Sleep: Good  Appetite:  Good  Current Medications: Current Facility-Administered Medications  Medication Dose Route Frequency Provider Last Rate Last Dose  . acetaminophen (TYLENOL) tablet 650 mg  650 mg Oral Q6H PRN Nira ConnBerry, Jason A, NP   650 mg at 04/16/17 0737  . alum & mag hydroxide-simeth (MAALOX/MYLANTA) 200-200-20 MG/5ML suspension 30 mL  30 mL Oral Q4H PRN Nira ConnBerry, Jason A, NP      . benztropine (COGENTIN) tablet 0.5 mg  0.5 mg Oral BID PRN Micheal Likensainville, Kindred Heying T, MD      . hydrOXYzine (ATARAX/VISTARIL) tablet 25 mg  25 mg Oral TID PRN Jackelyn PolingBerry, Jason A, NP   25 mg at 04/15/17 2100  . ibuprofen (ADVIL,MOTRIN) tablet 600 mg  600 mg Oral Q6H PRN Micheal Likensainville, Lular Letson T, MD      . LORazepam (ATIVAN) tablet 1 mg  1 mg Oral Q6H PRN Oneta RackLewis, Tanika N, NP   1 mg at 04/15/17 2100   Or  . LORazepam (ATIVAN) injection 1 mg  1 mg Intramuscular Q6H PRN Oneta RackLewis, Tanika N, NP       . magnesium hydroxide (MILK OF MAGNESIA) suspension 30 mL  30 mL Oral Daily PRN Nira ConnBerry, Jason A, NP      . OLANZapine zydis (ZYPREXA) disintegrating tablet 5 mg  5 mg Oral Q8H PRN Oneta RackLewis, Tanika N, NP      . Melene Muller[START ON 04/19/2017] paliperidone (INVEGA SUSTENNA) injection 156 mg  156 mg Intramuscular Q30 days Micheal Likens[place], Nathanyal Ashmead T, MD      . paliperidone (INVEGA) 24 hr  tablet 9 mg  9 mg Oral Daily Micheal Likens, MD   9 mg at 04/16/17 0737  . traZODone (DESYREL) tablet 100 mg  100 mg Oral QHS PRN Kerry Hough, PA-C   100 mg at 04/15/17 2100    Lab Results: No results found for this or any previous visit (from the past 48 hour(s)).  Blood Alcohol level:  Lab Results  Component Value Date   ETH <10 04/11/2017   ETH <10 01/16/2017    Metabolic Disorder Labs: Lab Results  Component Value Date   HGBA1C 5.5 04/13/2017   MPG 111.15 04/13/2017   Lab Results  Component Value Date   PROLACTIN 25.2 (H) 04/13/2017   Lab Results  Component Value Date   CHOL 199 04/13/2017   TRIG 71 04/13/2017   HDL 45 04/13/2017   CHOLHDL 4.4 04/13/2017   VLDL 14 04/13/2017   LDLCALC 140 (H) 04/13/2017   LDLCALC 98 09/14/2016    Physical Findings: AIMS: Facial and Oral Movements Muscles of Facial Expression: None, normal Lips and Perioral Area: None, normal Jaw: None, normal Tongue: None, normal,Extremity Movements Upper (arms, wrists, hands, fingers): None, normal Lower (legs, knees, ankles, toes): None, normal, Trunk Movements Neck, shoulders, hips: None, normal, Overall Severity Severity of abnormal movements (highest score from questions above): None, normal Incapacitation due to abnormal movements: None, normal Patient's awareness of abnormal movements (rate only patient's report): No Awareness, Dental Status Current problems with teeth and/or dentures?: No Does patient usually wear dentures?: No  CIWA:    COWS:     Musculoskeletal: Strength & Muscle Tone: within normal  limits Gait & Station: normal Patient leans: N/A  Psychiatric Specialty Exam: Physical Exam  Nursing note and vitals reviewed.   Review of Systems  Constitutional: Negative for chills and fever.  Respiratory: Negative for cough and shortness of breath.   Cardiovascular: Negative for chest pain.  Gastrointestinal: Negative for abdominal pain, heartburn, nausea and vomiting.  Psychiatric/Behavioral: Negative for depression, hallucinations and suicidal ideas. The patient is not nervous/anxious and does not have insomnia.     Blood pressure 120/74, pulse (!) 126, temperature 98.4 F (36.9 C), temperature source Oral, resp. rate 20, height 6\' 1"  (1.854 m), weight 90.7 kg (200 lb), SpO2 100 %.Body mass index is 26.39 kg/m.  General Appearance: Casual and Fairly Groomed  Eye Contact:  Good  Speech:  Clear and Coherent and Normal Rate  Volume:  Normal  Mood:  Euthymic  Affect:  Blunt and Labile  Thought Process:  Coherent, Goal Directed and Descriptions of Associations: Loose  Orientation:  Full (Time, Place, and Person)  Thought Content:  Illogical, Hallucinations: Auditory, Ideas of Reference:   Paranoia Delusions, Paranoid Ideation, Rumination and Abstract Reasoning  Suicidal Thoughts:  No  Homicidal Thoughts:  No  Memory:  Immediate;   Fair Recent;   Fair Remote;   Fair  Judgement:  Poor  Insight:  Lacking  Psychomotor Activity:  Normal  Concentration:  Concentration: Fair  Recall:  [employer] of Knowledge:  Fair  Language:  Fair  Akathisia:  No  Handed:    AIMS (if indicated):     Assets:  Communication Skills Leisure Time Resilience Social Support  ADL's:  Intact  Cognition:  WNL  Sleep:  Number of Hours: 5.25   Treatment Plan Summary: Daily contact with patient to assess and evaluate symptoms and progress in treatment and Medication management   -Continue inpatient hospitalization  Schizophrenia  -ContinueInvega 9 mg poqDay               -  Continue Invega Sustenna 156mg  IM q30 days starting on 04/19/17 Hinda Glatter Sustenna 234mg  IM given on 04/15/17)  -EPS - continue cogentin 0.5mg  PRN BID   - Anxiety -Continueatarax q8h prn anxiety  - Agitation Protocol  -Continue zydis 5mg  po q8h prn agitation             - Continue Ativan 1mg  po/IM q6h prn agitation  - Insomnia -Continuetrazodone 50mg  po qhs prn insomnia  -Encourage participation in groups and the therapeutic milieu -Discharge planning will be ongoing   Micheal Likens, MD 04/16/2017, 11:36 AM

## 2017-04-16 NOTE — Progress Notes (Signed)
D: Pt denies SI/HI/AVH. Pt is disorganized flight of ideas and labile. Pt visible on the unit this evening  Interacting appropriately with peers.   A: Pt was offered support and encouragement. Pt was given scheduled medications. Pt was encourage to attend groups. Q 15 minute checks were done for safety.   R:Pt attends groups and interacts well with peers and staff. Pt is taking medication. Pt has no complaints.Pt receptive to treatment and safety maintained on unit.

## 2017-04-16 NOTE — [facility] Group Notes (Signed)
.  LCSW Group Therapy Note   04/16/2017 1:15pm   Type of Therapy and Topic:  Group Therapy:  Positive Affirmations   Participation Level:  Active  Description of Group: This group addressed positive affirmation toward self and others. Patients went around the room and identified two positive things about themselves and two positive things about a peer in the room. Patients reflected on how it felt to share something positive with others, to identify positive things about themselves, and to hear positive things from others. Patients were encouraged to have a daily reflection of positive characteristics or circumstances.  Therapeutic Goals 1. Patient will verbalize two of their positive qualities 2. Patient will demonstrate empathy for others by stating two positive qualities about a peer in the group 3. Patient will verbalize their feelings when voicing positive self affirmations and when voicing positive affirmations of others 4. Patients will discuss the potential positive impact on their wellness/recovery of focusing on positive traits of self and others. Summary of Patient Progress:  Stayed the entire time, engaged throughout.  Continues to struggle with engaging in a meaningful way-is abstract and non-linear in his thoughts.  "Is that a consequence or a theory?" Interrupting, but redirectable.  Focused a lot of his attention on a new male patient.  Studied one of the band aids on her arm and asked if he could have it.  Therapeutic Modalities Cognitive Behavioral Therapy Motivational Interviewing  Ida RogueRodney B Elsi Stelzer, [place]CS[place] 04/16/2017 2:24 PM

## 2017-04-16 NOTE — Progress Notes (Signed)
Recreation Therapy Notes  Date: 4.9.19 Time: 10:00 a.m. Location: [address] Dayroom   Group Topic: Communication   Goal Area(s) Addresses:  Goal 1.1: To improve communication  - Group will communicate with peers during group session    - Group will identify the importance of healthy communication  - Group will answer at least three questions during Recreation Therapy tx  Behavioral Response: Engaged   Intervention: Game   Activity: Jenga: Patients played Jenga as normal. Once a patient pulled out a [place] piece, based on the color of the skittle, the patient answered a specific question for that color.   Education: Communication, Team-Work   Education Outcome: Acknowledges education  Clinical Observations/Feedback: Patient attended and participated appropriately in Recreation Therapy group treatment successfully engaging in group activity. Patient was able to communicate with peers in a appropriate manner with redirection from Recreation Therapy Intern. Patient was able to answer at least three questions about communication during Recreation Therapy Group treatment. Patient successfully met Goal 1.1 (see above).   Ranell Patrick, Recreation Therapy Intern   Ranell Patrick 04/16/2017 11:22 AM

## 2017-04-17 NOTE — Progress Notes (Signed)
Pt awake and in bed at shift change.  Pt makes good eye contact and is pleasant.  Pt sts he is leaving this Friday and only wants to take his tylenol and refuses his other medications.  Pt attends group with minimal interaction.   Pt is currently in bed sleeping. Pt remains safe on unit.

## 2017-04-17 NOTE — Progress Notes (Signed)
Recreation Therapy Notes  Date: 4.10.19 Time: 10:00 a.m.  Location: [address] Dayroom    Group Topic: Goal Setting, Positivity    Goal Area(s) Addresses:  Goal 1.1: Patients will identify different skills they use to create a positive life style  Patient will identify at least one goal for a positive lifestyle   Patient will identify the importance of a positive lifestyle   Patient will participate in Recreation Therapy group tx.    Behavioral Response: Appropriate    Intervention: Arts and Crafts    Activity: For the first part of the activity, patients were asked to try to balance a plastic egg on a flat surface. Patients realized that the egg was not be able to stand on its own without any support. Recreation Therapy Intern then passed out a cup of salt to the patients and ask them to try the task again. Having the patient resort to using salt illustrates that many things are possible but may require some outside-the-box thinking to achieve, just like with goals. After processing with patients, they had the opportunity to decorate their egg using the paint provided.    Education: Goal Setting, Positivity    Education Outcome: Acknowledges Education   Clinical Observations/Feedback: Patient attended and participated appropriately during Recreation Therapy group treatment successfully identifying at least one goal for a positive lifestyle. Patient communicated with peers in a appropriate manner. Patient was able to identify the importance of having a positive lifestyle. Patient participated during opening and closing discussion. Patient successfully met Goal 1.1 (See Above).    Ranell Patrick, Recreation Therapy Intern   Ranell Patrick 04/17/2017 11:35 AM

## 2017-04-17 NOTE — Progress Notes (Signed)
[facility] MD Progress Note  04/17/2017 10:25 AM [name]  MRN:  [id number] Subjective:    [name] is a 27 y/o M with history of treatment for psychosis who was admitted from [facility] on IVC placed by his mother due to worsening symptoms of psychosis including disorganization, paranoia, delusions, and agitation. Pt has delusion that a microchip has been placed in the back of his head. Pt has unpredictable behaviors, tangential responses, and poor insight. He has been roaming around his neighborhood. He has poor adherence to his outpatient treatment regimen. He has recent relevant history of discharge from [facility] on 01/22/17 when he was being treated for similar concerns.Pt was started on oral form of Invega, and dose has been titrated up during his stay. He has demonstrated ongoing symptoms of psychosis which have been incrementally improving during his stay. He was transitioned to long-acting injectable [name].  Today upon evaluation, pt shares, "Freedom is goal." He denies any specific concerns today. He denies physical complaints. He denies SI/HI/AH/VH. Pt was specifically asked about the microchip which he believes is implanted in his skull, and he replies, "I was bothering me for a second today and I got angry, but then it went away." Pt reports he is sleeping well and his appetite is good. He shares that he is tolerating his medications well and he feels that they have been helpful. He is in agreement to continue his current regimen without changes at this time.   Principal Problem: Schizophrenia, acute (HCC) Diagnosis:   Patient Active Problem List   Diagnosis Date Noted  . Schizophrenia, acute (HCC) [F23] 04/12/2017  . Psychosis (HCC) [F29] 01/17/2017  . 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 Screen for STD (sexually transmitted disease) [Z11.3] 09/14/2016  . Hyperesthesia [R20.3] 09/14/2016  . Testicular pain [N50.819]  09/14/2016  . Burning with urination [R30.0] 09/14/2016  . Left ear pain [H92.02] 09/14/2016  . Muscle strain [T14.8XXA] 11/09/2015  . Strain of back [S39.012A] 11/09/2015  . Back spasm [M62.830] 11/09/2015   Total Time spent with patient: 30 minutes  Past Psychiatric History: see H&P  Past Medical History:  Past Medical History:  Diagnosis Date  . Allergic rhinitis    mostly spring  . Depression   . Strep throat 04/2013   obervation in ED due to high fever, illness    Past Surgical History:  Procedure Laterality Date  . ORCHIOPEXY     bilat, in childhood   Family History:  Family History  Problem Relation Age of Onset  . Other Mother        "tumor of stomach"  . Anemia Mother   . Other Father        unknown  . Diabetes Brother        borderline  . Hypertension Maternal Grandmother   . Cancer Neg Hx   . Stroke Neg Hx   . Heart disease Neg Hx   . Aneurysm Neg Hx    Family Psychiatric  History: see H&P Social History:  Social History   Substance and Sexual Activity  Alcohol Use No     Social History   Substance and Sexual Activity  Drug Use Yes  . Types: Marijuana    Social History   Socioeconomic History  . Marital status: Single    Spouse name: Not on file  . Number of children: Not on file  . Years of education: Not on file  . Highest education level: Not  on file  Occupational History  . Not on file  Social Needs  . Financial resource strain: Not on file  . Food insecurity:    Worry: Not on file    Inability: Not on file  . Transportation needs:    Medical: Not on file    Non-medical: Not on file  Tobacco Use  . Smoking status: Former Games developer  . Smokeless tobacco: Never Used  Substance and Sexual Activity  . Alcohol use: No  . Drug use: Yes    Types: Marijuana  . Sexual activity: Yes    Birth control/protection: None  Lifestyle  . Physical activity:    Days per week: Not on file    Minutes per session: Not on file  . Stress: Not on  file  Relationships  . Social connections:    Talks on phone: Not on file    Gets together: Not on file    Attends religious service: Not on file    Active member of club or organization: Not on file    Attends meetings of clubs or organizations: Not on file    Relationship status: Not on file  Other Topics Concern  . 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   Lives with parents, from [place], high school at [employer], [place] for a while, dropped out.  Wants to go into HVAC work.  Exercise - plays basketball regularly.  Currently trying to find work to [employer] to return to school.   Prior incarceration x 50mo for theft.     Additional Social History:                         Sleep: Good  Appetite:  Good  Current Medications: Current Facility-Administered Medications  Medication Dose Route Frequency Provider Last Rate Last Dose  . acetaminophen (TYLENOL) tablet 650 mg  650 mg Oral Q6H PRN Nira Conn A, NP   650 mg at 04/16/17 0737  . alum & mag hydroxide-simeth (MAALOX/MYLANTA) 200-200-20 MG/5ML suspension 30 mL  30 mL Oral Q4H PRN Nira Conn A, NP      . benztropine (COGENTIN) tablet 0.5 mg  0.5 mg Oral BID PRN Micheal Likens, MD      . hydrOXYzine (ATARAX/VISTARIL) tablet 25 mg  25 mg Oral TID PRN Micheal Likens, MD   25 mg at 04/16/17 2103  . ibuprofen (ADVIL,MOTRIN) tablet 600 mg  600 mg Oral Q6H PRN Micheal Likens, MD      . LORazepam (ATIVAN) tablet 1 mg  1 mg Oral Q6H PRN Micheal Likens, MD       Or  . LORazepam (ATIVAN) injection 1 mg  1 mg Intramuscular Q6H PRN Micheal Likens, MD      . magnesium hydroxide (MILK OF MAGNESIA) suspension 30 mL  30 mL Oral Daily PRN Nira Conn A, NP      . OLANZapine zydis (ZYPREXA) disintegrating tablet 5 mg  5 mg Oral Q8H PRN Micheal Likens, MD      . Melene Muller ON 04/19/2017] paliperidone (INVEGA  SUSTENNA) injection 156 mg  156 mg Intramuscular Q30 days Jolyne Loa T, MD      . paliperidone (INVEGA) 24 hr tablet 9 mg  9 mg Oral Daily Micheal Likens, MD   9 mg at 04/17/17 0752  . QUEtiapine (SEROQUEL) tablet 50 mg  50 mg Oral  QHS Donell SievertSimon, Spencer E, PA-C   50 mg at 04/16/17 2103  . traZODone (DESYREL) tablet 100 mg  100 mg Oral QHS PRN Kerry HoughSimon, Spencer E, PA-C   100 mg at 04/16/17 2103    Lab Results: No results found for this or any previous visit (from the past 48 hour(s)).  Blood Alcohol level:  Lab Results  Component Value Date   ETH <10 04/11/2017   ETH <10 01/16/2017    Metabolic Disorder Labs: Lab Results  Component Value Date   HGBA1C 5.5 04/13/2017   MPG 111.15 04/13/2017   Lab Results  Component Value Date   PROLACTIN 25.2 (H) 04/13/2017   Lab Results  Component Value Date   CHOL 199 04/13/2017   TRIG 71 04/13/2017   HDL 45 04/13/2017   CHOLHDL 4.4 04/13/2017   VLDL 14 04/13/2017   LDLCALC 140 (H) 04/13/2017   LDLCALC 98 09/14/2016    Physical Findings: AIMS: Facial and Oral Movements Muscles of Facial Expression: None, normal Lips and Perioral Area: None, normal Jaw: None, normal Tongue: None, normal,Extremity Movements Upper (arms, wrists, hands, fingers): None, normal Lower (legs, knees, ankles, toes): None, normal, Trunk Movements Neck, shoulders, hips: None, normal, Overall Severity Severity of abnormal movements (highest score from questions above): None, normal Incapacitation due to abnormal movements: None, normal Patient's awareness of abnormal movements (rate only patient's report): No Awareness, Dental Status Current problems with teeth and/or dentures?: No Does patient usually wear dentures?: No  CIWA:    COWS:     Musculoskeletal: Strength & Muscle Tone: within normal limits Gait & Station: normal Patient leans: N/A  Psychiatric Specialty Exam: Physical Exam  Nursing note and vitals reviewed.   Review of  Systems  Constitutional: Negative for chills and fever.  Respiratory: Negative for cough and shortness of breath.   Cardiovascular: Negative for chest pain.  Gastrointestinal: Negative for abdominal pain, heartburn, nausea and vomiting.  Psychiatric/Behavioral: Positive for hallucinations. Negative for depression and suicidal ideas. The patient is not nervous/anxious and does not have insomnia.     Blood pressure (!) 127/59, pulse (!) 140, temperature 98.4 F (36.9 C), temperature source Oral, resp. rate 18, height 6\' 1"  (1.854 m), weight 90.7 kg (200 lb), SpO2 100 %.Body mass index is 26.39 kg/m.  General Appearance: Casual and Fairly Groomed  Eye Contact:  Good  Speech:  Clear and Coherent and Normal Rate  Volume:  Normal  Mood:  Euthymic  Affect:  Appropriate, Blunt and Congruent  Thought Process:  Coherent and Goal Directed  Orientation:  Full (Time, Place, and Person)  Thought Content:  Delusions and Hallucinations: Tactile  Suicidal Thoughts:  No  Homicidal Thoughts:  No  Memory:  Immediate;   Fair Recent;   Fair Remote;   Fair  Judgement:  Fair  Insight:  Lacking  Psychomotor Activity:  Normal  Concentration:  Concentration: Fair  Recall:  FiservFair  Fund of Knowledge:  Fair  Language:  Fair  Akathisia:  No  Handed:    AIMS (if indicated):     Assets:  Communication Skills Resilience Social Support  ADL's:  Intact  Cognition:  WNL  Sleep:  Number of Hours: 5.25   Treatment Plan Summary: Daily contact with patient to assess and evaluate symptoms and progress in treatment and Medication management   -Continue inpatient hospitalization  Schizophrenia  -ContinueInvega 9 mg poqDay  - Continue Invega Sustenna 156mg  IM q30 days starting on 04/19/17 Hinda Glatter(Invega Sustenna 234mg  IM given on 04/15/17)  -EPS - continue  cogentin 0.5mg  PRN BID   - Anxiety -Continueatarax q8h prn anxiety  - Agitation  Protocol -Continue zydis 5mg  po q8h prn agitation - Continue Ativan 1mg  po/IM q6h prn agitation  - Insomnia -Continuetrazodone 50mg  po qhs prn insomnia  -Encourage participation in groups and the therapeutic milieu -Discharge planning will be ongoing  Micheal Likens, MD 04/17/2017, 10:25 AM

## 2017-04-17 NOTE — Progress Notes (Signed)
Patient has been calm and cooperative on the unit.  Patient remains bizarre in his interaction with staff and peers.  Patient needed redirection due to inappropriate boundaries with male peer.  Patient currently denies SI, HI and AVH.   Assess patient for safety, offer medications as prescribed engage patient in 1:1 staff talks.   Continue to monitor as planned. Patient able to contract for safety.    

## 2017-04-17 NOTE — [facility] Group Notes (Signed)
LCSW Group Therapy Note   04/17/2017 1:15pm   Type of Therapy and Topic:  Group Therapy:  Trust and Honesty  Participation Level:  Did Not Attend  Description of Group:    In this group patients will be asked to explore the value of being honest.  Patients will be guided to discuss their thoughts, feelings, and behaviors related to honesty and trusting in others. Patients will process together how trust and honesty relate to forming relationships with peers, family members, and self. Each patient will be challenged to identify and express feelings of being vulnerable. Patients will discuss reasons why people are dishonest and identify alternative outcomes if one was truthful (to self or others). This group will be process-oriented, with patients participating in exploration of their own experiences, giving and receiving support, and processing challenge from other group members.   Therapeutic Goals: 1. Patient will identify why honesty is important to relationships and how honesty overall affects relationships.  2. Patient will identify a situation where they lied or were lied too and the  feelings, thought process, and behaviors surrounding the situation 3. Patient will identify the meaning of being vulnerable, how that feels, and how that correlates to being honest with self and others. 4. Patient will identify situations where they could have told the truth, but instead lied and explain reasons of dishonesty.   Summary of Patient Progress    Therapeutic Modalities:   Cognitive Behavioral Therapy Solution Focused Therapy Motivational Interviewing Brief Therapy  Ida RogueRodney B Netty Sullivant, LCSW 04/17/2017 12:32 PM

## 2017-04-18 NOTE — Progress Notes (Signed)
Patient has been calm and cooperative on the unit.  Patient remains bizarre in his interaction with staff and peers.  Patient needed redirection due to inappropriate boundaries with male peer.  Patient currently denies SI, HI and AVH.   Assess patient for safety, offer medications as prescribed engage patient in 1:1 staff talks.   Continue to monitor as planned. Patient able to contract for safety.

## 2017-04-18 NOTE — [facility] Group Notes (Signed)
[facility] LCSW Group Therapy 04/18/2017 1:15pm  Type of Therapy: Group Therapy- Feelings Around Discharge & Establishing a Supportive Framework  Participation Level:  Minimal  Description of Group:   What is a supportive framework? What does it look like feel like and how do I discern it from and unhealthy non-supportive network? Learn how to cope when supports are not helpful and don't support you. Discuss what to do when your family/friends are not supportive.  Summary of Patient Progress  Oshua came to group late.  He sat in the back quietly and listened to the story that was being shared.  When asked what his strength in his recovery is he shared that having patience is what keeps him going.   Therapeutic Modalities:   Cognitive Behavioral Therapy Person-Centered Therapy Motivational Interviewing   Carlynn Heraldngel M Dariyon Urquilla, Student-Social Work 04/18/2017 2:37 PM

## 2017-04-18 NOTE — Progress Notes (Signed)
Recreation Therapy Notes  Date: 4.11.19 Time: 10:00 a.m.  Location: [address] Dayroom   Group Topic: Self-Awareness   Goal Area(s) Addresses:  Goal 1.1: To increase self-awareness   - Patient will identify the importance of self-awareness  - Patient will identify at least three things of how others view them  - Patient will identify at least three things of how they view themselves   Intervention: Craft   Activity: Patients were instructed to decorate the outside of their bag using the magazines provided to represent how other people see them and put things inside that represent how they really are on the inside. At the end of the session, patients shared their bags with one another.  Education: Chief Executive Officerelf-Awareness   Education Outcome:Acknowledges Education  Clinical Observations/Feedback: Patient did not attend   Sheryle HailDarian Lafe Clerk, Recreation Therapy Intern   Sheryle HailDarian Peja Allender 04/18/2017 12:57 PM

## 2017-04-18 NOTE — Progress Notes (Signed)
Patient ID: [name], male   DOB: 07-Nov-1990, 27 y.o.   MRN: [id number]  D: Patient in his room most of the evening. Pt was calm and cooperative and answered all of writer's questions.  Pt reports he had a good day and is looking forward to possible discharge tomorrow. Pt reports he is tolerating medication well. Pt denies SI/HI/AVH.  Cooperative with assessment. No acute distressed noted at this time.   A: Medications administered as prescribed. Support and encouragement provided to attend groups and engage in milieu. Pt encouraged to discuss feelings and come to staff with any question or concerns.   R: Patient remains safe and complaint with medications.

## 2017-04-18 NOTE — Progress Notes (Signed)
Psychoeducational Group Note  Date:  04/18/2017 Time:  2053  Group Topic/Focus:  Wrap-Up Group:   The focus of this group is to help patients review their daily goal of treatment and discuss progress on daily workbooks.  Participation Level: Did Not Attend  Participation Quality:  Not Applicable  Affect:  Not Applicable  Cognitive:  Not Applicable  Insight:  Not Applicable  Engagement in Group: Not Applicable  Additional Comments:  The patient did not attend group this evening.   Hazle CocaGOODMAN, Bridgit Eynon S 04/18/2017, 8:53 PM

## 2017-04-18 NOTE — Progress Notes (Signed)
[facility]  04/18/2017 2:06 PM Festus AloeRashawn A Haynes  MRN:  [id number] Subjective:    Gene BeersRashawn Haynes is a 27 y/o M with history of treatment for psychosis who was admitted from [facility] on IVC placed by his mother due to worsening symptoms of psychosis including disorganization, paranoia, delusions, and agitation. Pt has delusion that a microchip has been placed in the back of his head. Pt has unpredictable behaviors, tangential responses, and poor insight. He has been roaming around his neighborhood. He has poor adherence to his outpatient treatment regimen. He has recent relevant [facility] when he was being treated for similar concerns.Pt was started on oral form of Invega, and dose has been titrated up during his stay. He was transitioned to long-acting injectable Gean BirchwoodInvega Sustenna. He has demonstrated ongoing symptoms of psychosis which have been incrementally improving during his stay  Today upon evaluation, pt shares,"I'm doing pretty good." He denies any specific concerns. He is sleeping well. His appetite is good. He denies SI/HI/AH/VH; however, he reports that he is still having sensation of implanted microchip in his head. He shares, "It keeps staying bugging me, but I just start talking and it goes away." He reports that he is tolerating his current medication regimen well, and he is in agreement to continue his current treatment plan without changes. He is anticipating to receive the booster injection of [place]ga Sustenna tomorrow, and he feels that he will be safe to discharge to home after that. He plans to try to obtain a job after discharge. Pt had no further questions, comments, or concerns today.  Principal Problem: Schizophrenia, acute (HCC) Diagnosis:   Patient Active Problem List   Diagnosis Date Noted  . Schizophrenia, acute (HCC) [F23] 04/12/2017  . Psychosis (HCC) [F29] 01/17/2017  . MDD (major depressive disorder), recurrent, severe, with  psychosis (HCC) [F33.3] 01/16/2017  . Screen for STD (sexually transmitted disease) [Z11.3] 09/14/2016  . Hyperesthesia [R20.3] 09/14/2016  . Testicular pain [N50.819] 09/14/2016  . Burning with urination [R30.0] 09/14/2016  . Left ear pain [H92.02] 09/14/2016  . Muscle strain [T14.8XXA] 11/09/2015  . Strain of back [S39.012A] 11/09/2015  . Back spasm [M62.830] 11/09/2015   Total Time spent with patient: 30 minutes  Past Psychiatric History: see H&P  Past Medical History:  Past Medical History:  Diagnosis Date  . Allergic rhinitis    mostly spring  . Depression   . Strep throat 04/2013   obervation in ED due to high fever, illness    Past Surgical History:  Procedure Laterality Date  . ORCHIOPEXY     bilat, in childhood   Family History:  Family History  Problem Relation Age of Onset  . Other Mother        "tumor of stomach"  . Anemia Mother   . Other Father        unknown  . Diabetes Brother        borderline  . Hypertension Maternal Grandmother   . Cancer Neg Hx   . Stroke Neg Hx   . Heart disease Neg Hx   . Aneurysm Neg Hx    Family Psychiatric  History: see H&P Social History:  Social History   Substance and Sexual Activity  Alcohol Use No     Social History   Substance and Sexual Activity  Drug Use Yes  . Types: Marijuana    Social History   Socioeconomic History  . Marital status: Single    Spouse name:  Not on file  . Number of children: Not on file  . Years of education: Not on file  . Highest education level: Not on file  Occupational History  . Not on file  Social Needs  . Financial resource strain: Not on file  . Food insecurity:    Worry: Not on file    Inability: Not on file  . Transportation needs:    Medical: Not on file    Non-medical: Not on file  Tobacco Use  . Smoking status: Former Games developer  . Smokeless tobacco: Never Used  Substance and Sexual Activity  . Alcohol use: No  . Drug use: Yes    Types: Marijuana  . Sexual  activity: Yes    Birth control/protection: None  Lifestyle  . Physical activity:    Days per week: Not on file    Minutes per session: Not on file  . Stress: Not on file  Relationships  . Social connections:    Talks on phone: Not on file    Gets together: Not on file    Attends religious service: Not on file    Active member of club or organization: Not on file    Attends meetings of clubs or organizations: Not on file    Relationship status: Not on file  Other Topics Concern  . 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   Lives with parents, from [place], high school at [employer], [place] for a while, dropped out.  Wants to go into HVAC work.  Exercise - plays basketball regularly.  Currently trying to find work to [employer] to return to school.   Prior incarceration x 26mo for theft.     Additional Social History:                         Sleep: Good  Appetite:  Fair  Current Medications: Current Facility-Administered Medications  Medication Dose Route Frequency Provider Last Rate Last Dose  . acetaminophen (TYLENOL) tablet 650 mg  650 mg Oral Q6H PRN Nira Conn A, NP   650 mg at 04/17/17 2146  . alum & mag hydroxide-simeth (MAALOX/MYLANTA) 200-200-20 MG/5ML suspension 30 mL  30 mL Oral Q4H PRN Nira Conn A, NP      . benztropine (COGENTIN) tablet 0.5 mg  0.5 mg Oral BID PRN Micheal Likens, MD      . hydrOXYzine (ATARAX/VISTARIL) tablet 25 mg  25 mg Oral TID PRN Micheal Likens, MD   25 mg at 04/16/17 2103  . ibuprofen (ADVIL,MOTRIN) tablet 600 mg  600 mg Oral Q6H PRN Micheal Likens, MD      . LORazepam (ATIVAN) tablet 1 mg  1 mg Oral Q6H PRN Micheal Likens, MD       Or  . LORazepam (ATIVAN) injection 1 mg  1 mg Intramuscular Q6H PRN Micheal Likens, MD      . magnesium hydroxide (MILK OF MAGNESIA) suspension 30 mL  30 mL Oral Daily PRN Nira Conn A, NP      . OLANZapine zydis (ZYPREXA) disintegrating tablet 5 mg  5 mg Oral Q8H PRN Micheal Likens, MD      . Melene Muller ON 04/19/2017] paliperidone (INVEGA SUSTENNA) injection 156 mg  156 mg Intramuscular Q30 days Jolyne Loa T, MD      . paliperidone (INVEGA) 24 hr tablet 9 mg  9  mg Oral Daily Micheal Likens, MD   9 mg at 04/18/17 0747  . traZODone (DESYREL) tablet 100 mg  100 mg Oral QHS PRN Kerry Hough, PA-C   100 mg at 04/16/17 2103    Lab Results: No results found for this or any previous visit (from the past 48 hour(s)).  Blood Alcohol level:  Lab Results  Component Value Date   ETH <10 04/11/2017   ETH <10 01/16/2017    Metabolic Disorder Labs: Lab Results  Component Value Date   HGBA1C 5.5 04/13/2017   MPG 111.15 04/13/2017   Lab Results  Component Value Date   PROLACTIN 25.2 (H) 04/13/2017   Lab Results  Component Value Date   CHOL 199 04/13/2017   TRIG 71 04/13/2017   HDL 45 04/13/2017   CHOLHDL 4.4 04/13/2017   VLDL 14 04/13/2017   LDLCALC 140 (H) 04/13/2017   LDLCALC 98 09/14/2016    Physical Findings: AIMS: Facial and Oral Movements Muscles of Facial Expression: None, normal Lips and Perioral Area: None, normal Jaw: None, normal Tongue: None, normal,Extremity Movements Upper (arms, wrists, hands, fingers): None, normal Lower (legs, knees, ankles, toes): None, normal, Trunk Movements Neck, shoulders, hips: None, normal, Overall Severity Severity of abnormal movements (highest score from questions above): None, normal Incapacitation due to abnormal movements: None, normal Patient's awareness of abnormal movements (rate only patient's report): No Awareness, Dental Status Current problems with teeth and/or dentures?: No Does patient usually wear dentures?: No  CIWA:    COWS:     Musculoskeletal: Strength & Muscle Tone: within normal limits Gait & Station: normal Patient leans: N/A  Psychiatric Specialty  Exam: Physical Exam  Nursing note and vitals reviewed.   Review of Systems  Constitutional: Negative for chills and fever.  Respiratory: Negative for cough and shortness of breath.   Cardiovascular: Negative for chest pain.  Gastrointestinal: Negative for abdominal pain, heartburn, nausea and vomiting.  Psychiatric/Behavioral: Positive for hallucinations. Negative for depression and suicidal ideas. The patient is not nervous/anxious and does not have insomnia.     Blood pressure 137/79, pulse (!) 113, temperature 98.4 F (36.9 C), temperature source Oral, resp. rate 18, height 6\' 1"  (1.854 m), weight 90.7 kg (200 lb), SpO2 100 %.Body mass index is 26.39 kg/m.  General Appearance: Casual and Fairly Groomed  Eye Contact:  Good  Speech:  Clear and Coherent and Normal Rate  Volume:  Normal  Mood:  Euthymic  Affect:  Appropriate, Congruent and Flat  Thought Process:  Coherent and Goal Directed  Orientation:  Full (Time, Place, and Person)  Thought Content:  Delusions and Hallucinations: Tactile  Suicidal Thoughts:  No  Homicidal Thoughts:  No  Memory:  Immediate;   Fair Recent;   Fair Remote;   Fair  Judgement:  Fair  Insight:  Lacking  Psychomotor Activity:  Normal  Concentration:  Concentration: Fair  Recall:  [employer] of Knowledge:  Fair  Language:  Fair  Akathisia:  No  Handed:    AIMS (if indicated):     Assets:  Communication Skills Resilience Social Support  ADL's:  Intact  Cognition:  WNL  Sleep:  Number of Hours: 6.75   Treatment Plan Summary: Daily contact with patient to assess and evaluate symptoms and progress in treatment and Medication management   -Continue inpatient hospitalization  Schizophrenia  -ContinueInvega 9 mg poqDay  -ContinueInvega Sustenna 156mg  IM q30 days starting on 04/19/17(Invega Sustenna 234mg  IM given on 04/15/17)  -EPS - continue cogentin 0.5mg   PRN BID   -  Anxiety -Continueatarax q8h prn anxiety  - Agitation Protocol -Continue zydis 5mg  po q8h prn agitation - Continue Ativan 1mg  po/IM q6h prn agitation  - Insomnia -Continuetrazodone 50mg  po qhs prn insomnia  -Encourage participation in groups and the therapeutic milieu -Discharge planning will be ongoing  Micheal Likens, MD 04/18/2017, 2:06 PM

## 2017-04-19 DIAGNOSIS — F419 Anxiety disorder, unspecified: Secondary | ICD-10-CM

## 2017-04-19 DIAGNOSIS — F209 Schizophrenia, unspecified: Secondary | ICD-10-CM

## 2017-04-19 MED ORDER — BENZTROPINE MESYLATE 0.5 MG PO TABS: 0.5000 mg | ORAL_TABLET | Freq: Two times a day (BID) | ORAL | 0 refills | Status: DC | PRN

## 2017-04-19 MED ORDER — PALIPERIDONE ER 9 MG PO TB24: 9.0000 mg | ORAL_TABLET | Freq: Every day | ORAL | 0 refills | Status: DC

## 2017-04-19 MED ORDER — TRAZODONE HCL 100 MG PO TABS: 100.0000 mg | ORAL_TABLET | Freq: Every evening | ORAL | 0 refills | Status: DC | PRN

## 2017-04-19 MED ORDER — HYDROXYZINE HCL 25 MG PO TABS: 25.0000 mg | ORAL_TABLET | Freq: Three times a day (TID) | ORAL | 0 refills | Status: DC | PRN

## 2017-04-19 MED ORDER — PALIPERIDONE PALMITATE 156 MG/ML IM SUSP: 156.0000 mg | INTRAMUSCULAR | 0 refills | Status: DC

## 2017-04-19 NOTE — Progress Notes (Addendum)
Recreation Therapy Notes  Date: 4.12.19 Time: 10 a.m. Location: [address] Dayroom   Group Topic: Stress Management, Forgiveness    Goal Area(s) Addresses:  Goal 1.1: To reduce stress  -Patient will identify how forgiveness can be beneficial  -Patient will identify the importance of stress management  -Patient will participate during Recreation Therapy group tx.    Behavioral Response: Engaged   Intervention: Stress Management   Activity: Meditation- Patients were in a peaceful environment with soft lighting enhancing patients mood. Patients listened to a forgiveness meditation on the calm app to help decrease stress levels   Education: Stress Management, Discharge Planning.    Education Outcome: Acknowledges edcuation/In group clarification offered/Needs additional education   Clinical Observations/Feedback:: Patient attended and participated appropriately during Recreation Therapy group treatment successfully identifying how forgiveness can be beneficial. Patient was able to identify the importance of stress management. Patient participated during opening discussion and actively listened during closing discussion. Patient successfully met Goal 1.1 (See Above)    Ranell Patrick, Recreation Therapy Intern   Ranell Patrick 04/19/2017 11:18 AM

## 2017-04-19 NOTE — Progress Notes (Signed)
  [facility] :  Will you be returning to the same living situation after discharge:  Yes,  Home At discharge, do you have transportation home?: Yes,  Family  Do you have the ability to pay for your medications: Yes,  Insurance   Release of information consent forms completed and in the chart;  Patient's signature needed at discharge.  Patient to Follow up at: Follow-up Information    [facility] Follow up on 04/22/2017.   Why:  Follow up is schedueld for Monday at 1:15 Contact information: [address] [place] [address] [phone]           Next level of care provider [facility] Link:no  Safety Planning and Suicide Prevention discussed: Yes,  Yes     Has patient been referred to the Quitline?: Patient refused referral  Patient has been referred for addiction treatment: Pt. refused referral  Aram BeechamAngel M Ayron Fillinger, Student-Social Work 04/19/2017, 9:39 AM

## 2017-04-19 NOTE — Plan of Care (Signed)
4.12.19. Pt. Engaged in groups with a calm and appropriate mood at least 2x within 5 RT group sessions  

## 2017-04-19 NOTE — Progress Notes (Signed)
Patient ID: [name], male   DOB: 08-Aug-1990, 27 y.o.   MRN: [id number] Patient discharged to home/self care in the company of his mother.  Patient denies SI, HI and AVH upon discharged.  Patient acknowledged understanding of all discharge instructions and receipt of personal belongings.

## 2017-04-19 NOTE — [facility] Suicide Risk Assessment (Signed)
[facility]   Principal Problem: Schizophrenia, acute [facility](HCC) Discharge Diagnoses:  Patient Active Problem List   Diagnosis Date Noted  . Schizophrenia, acute (HCC) [F23] 04/12/2017  . Psychosis (HCC) [F29] 01/17/2017  . 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 Screen for STD (sexually transmitted disease) [Z11.3] 09/14/2016  . Hyperesthesia [R20.3] 09/14/2016  . Testicular pain [N50.819] 09/14/2016  . Burning with urination [R30.0] 09/14/2016  . Left ear pain [H92.02] 09/14/2016  . Muscle strain [T14.8XXA] 11/09/2015  . Strain of back [S39.012A] 11/09/2015  . Back spasm [M62.830] 11/09/2015    Total Time spent with patient: 30 minutes  Musculoskeletal: Strength & Muscle Tone: within normal limits Gait & Station: normal Patient leans: N/A  Psychiatric Specialty Exam: Review of Systems  Constitutional: Negative for chills and fever.  Respiratory: Negative for cough and shortness of breath.   Cardiovascular: Negative for chest pain.  Gastrointestinal: Negative for abdominal pain, heartburn, nausea and vomiting.  Psychiatric/Behavioral: Positive for hallucinations. Negative for depression and suicidal ideas. The patient is not nervous/anxious and does not have insomnia.     Blood pressure (!) 141/79, pulse (!) 129, temperature 98 F (36.7 C), temperature source Oral, resp. rate 18, height 6\' 1"  (1.854 m), weight 90.7 kg (200 lb), SpO2 100 %.Body mass index is 26.39 kg/m.  General Appearance: Casual and Fairly Groomed  Patent attorneyye Contact::  Good  Speech:  Clear and Coherent and Normal Rate  Volume:  Normal  Mood:  Euthymic  Affect:  Congruent and Flat  Thought Process:  Coherent, Goal Directed and Descriptions of Associations: Loose  Orientation:  Full (Time, Place, and Person)  Thought Content:  Hallucinations: Auditory Tactile and Ideas of Reference:   Paranoia Delusions  Suicidal Thoughts:  No   Homicidal Thoughts:  No  Memory:  Immediate;   Fair Recent;   Fair Remote;   Fair  Judgement:  Poor  Insight:  Lacking  Psychomotor Activity:  Normal  Concentration:  Fair  Recall:  [employer]  Fund of Knowledge:Fair  Language: Fair  Akathisia:  No  Handed:    AIMS (if indicated):     Assets:  Desire for Improvement Housing Physical Health Resilience Social Support  Sleep:  Number of Hours: 6.75  Cognition: WNL  ADL's:  Intact   Mental Status Per Nursing Assessment::   On Admission:     Demographic Factors:  Male, Adolescent or young adult, Low socioeconomic status and Unemployed  Loss Factors: Financial problems/change in socioeconomic status  Historical Factors: Impulsivity  Risk Reduction Factors:   Living with another person, especially a relative, Positive social support, Positive therapeutic relationship and Positive coping skills or problem solving skills  Continued Clinical Symptoms:  Schizophrenia:   Paranoid or undifferentiated type More than one psychiatric diagnosis Currently Psychotic Previous Psychiatric Diagnoses and Treatments  Cognitive Features That Contribute To Risk:  None    Suicide Risk:  Minimal: No identifiable suicidal ideation.  Patients presenting with no risk factors but with morbid ruminations; may be classified as minimal risk based on the severity of the depressive symptoms  Follow-up Information    [facility] on 04/22/2017.   Why:  Follow up is schedueld for Monday at 1:15 Contact information: [address] [place] [address] [phone]         Subjective Data:  [name] is a 27 y/o M with history of treatment for psychosis who was admitted from [facility] on IVC  placed by his mother due to worsening symptoms of psychosis including disorganization, paranoia, delusions, and agitation. Pt has delusion that a microchip has been placed in the back of his head. Pt has unpredictable  behaviors, tangential responses, and poor insight. He has been roaming around his neighborhood. He has poor adherence to his outpatient treatment regimen. He has recent relevant history of discharge from [facility] on [facility]/22/17 when he was being treated for similar concerns.Pt was started on oral form of Invega, and dose has been titrated up during his stay. He wastransitioned tolong-acting injectable [name]. He has demonstrated ongoing symptoms of psychosis which have been incrementally improving during his stay  Today upon evaluation, pt shares,"I'm doing pretty good." He denies any specific concerns today, and states that he feels ready to discharge to home. He denies physical complaints, but he does endorse ongoing tactile hallucination of microchip embedded in the back of his skull. He describes it, "It goes up and down, but it's better than the last time I was here." He endorses ongoing AH of "people talking in the background" but he denies that they are bothersome or intrusive. He denies SI/HI/VH. He is sleeping adequately. His appetite is good. He is in agreement to continue his current treatment regimen without changes, and he will receive booster injection of [place] today. He is in agreement to [facility]. He was able to engage in safety planning including plan to return to [facility] or contact emergency services if he feels unable to maintain his own safety or the safety of others. Pt had no further questions, comments, or concerns.   Plan Of Care/Follow-up recommendations:   -Discharge to outpatient level of care  Schizophrenia  -ContinueInvega 9 mg poqDay  -ContinueInvega Sustenna 156mg  IM q30 days - administer today 04/19/17(Invega Sustenna 234mg  IM given on 04/15/17)  -EPS - continue cogentin 0.5mg  PRN BID   - Anxiety -Continueatarax q8h prn anxiety  -  Insomnia -Continuetrazodone 50mg  po qhs prn insomnia  Activity:  as tolerated Diet:  normal Tests:  NA Other:  see above for DC plan  Micheal Likens, MD 04/19/2017, 10:17 AM

## 2017-04-19 NOTE — Tx Team (Signed)
Interdisciplinary Treatment and Diagnostic Plan Update  04/19/2017 Time of Session: 9:40 AM  [name] MRN: [id number]  Principal Diagnosis: Schizophrenia, acute ([place])  Secondary Diagnoses: Principal Problem:   Schizophrenia, acute ([place])   Current Medications:  Current Facility-Administered Medications  Medication Dose Route Frequency Provider Last Rate Last Dose  . acetaminophen (TYLENOL) tablet 650 mg  650 mg Oral Q6H PRN Lindon Romp A, NP   650 mg at 04/18/17 2113  . alum & mag hydroxide-simeth (MAALOX/MYLANTA) 200-200-20 MG/5ML suspension 30 mL  30 mL Oral Q4H PRN Lindon Romp A, NP      . benztropine (COGENTIN) tablet 0.5 mg  0.5 mg Oral BID PRN Pennelope Bracken, MD      . hydrOXYzine (ATARAX/VISTARIL) tablet 25 mg  25 mg Oral TID PRN Pennelope Bracken, MD   25 mg at 04/16/17 2103  . ibuprofen (ADVIL,MOTRIN) tablet 600 mg  600 mg Oral Q6H PRN Pennelope Bracken, MD      . LORazepam (ATIVAN) tablet 1 mg  1 mg Oral Q6H PRN Pennelope Bracken, MD       Or  . LORazepam (ATIVAN) injection 1 mg  1 mg Intramuscular Q6H PRN Pennelope Bracken, MD      . magnesium hydroxide (MILK OF MAGNESIA) suspension 30 mL  30 mL Oral Daily PRN Lindon Romp A, NP      . OLANZapine zydis (ZYPREXA) disintegrating tablet 5 mg  5 mg Oral Q8H PRN Pennelope Bracken, MD      . paliperidone (INVEGA SUSTENNA) injection 156 mg  156 mg Intramuscular Q30 days Maris Berger T, MD      . paliperidone (INVEGA) 24 hr tablet 9 mg  9 mg Oral Daily Pennelope Bracken, MD   9 mg at 04/19/17 0742  . traZODone (DESYREL) tablet 100 mg  100 mg Oral QHS PRN Laverle Hobby, PA-C   100 mg at 04/18/17 2113    PTA Medications: Medications Prior to Admission  Medication Sig Dispense Refill Last Dose  . benztropine (COGENTIN) 0.5 MG tablet Take 1 tablet (0.5 mg total) by mouth 2 (two) times daily. For prevention of drug induced tremors 60 tablet 0   . hydrOXYzine  (ATARAX/VISTARIL) 25 MG tablet Take 1 tablet (25 mg total) by mouth 3 (three) times daily as needed for anxiety. 60 tablet 0   . risperiDONE (RISPERDAL) 1 MG tablet Take 1 tablet (1 mg) by mouth in the morning & 2 tablets (2 mg) at bedtime: For mood control 90 tablet 0   . traZODone (DESYREL) 50 MG tablet Take 1 tablet (50 mg total) by mouth at bedtime as needed for sleep. 30 tablet 0     Patient Stressors: Medication change or noncompliance  Patient Strengths: Communication skills  Treatment Modalities: Medication Management, Group therapy, Case management,  1 to 1 session with clinician, Psychoeducation, Recreational therapy.   Physician Treatment Plan for Primary Diagnosis: Schizophrenia, acute ([place]) Long Term Goal(s): Improvement in symptoms so as ready for discharge  Short Term Goals: Compliance with prescribed medications will improve Ability to identify and develop effective coping behaviors will improve  Medication Management: Evaluate patient's response, side effects, and tolerance of medication regimen.  Therapeutic Interventions: 1 to 1 sessions, Unit Group sessions and Medication administration.  Evaluation of Outcomes: Progressing  Physician Treatment Plan for Secondary Diagnosis: Principal Problem:   Schizophrenia, acute ([place])   Long Term Goal(s): Improvement in symptoms so as ready for discharge  Short Term Goals: Compliance with  prescribed medications will improve Ability to identify and develop effective coping behaviors will improve  Medication Management: Evaluate patient's response, side effects, and tolerance of medication regimen.  Therapeutic Interventions: 1 to 1 sessions, Unit Group sessions and Medication administration.  Evaluation of Outcomes: Progressing   RN Treatment Plan for Primary Diagnosis: Schizophrenia, acute ([place]) Long Term Goal(s): Knowledge of disease and therapeutic regimen to maintain health will improve  Short Term Goals: Ability  to identify and develop effective coping behaviors will improve and Compliance with prescribed medications will improve  Medication Management: RN will administer medications as ordered by provider, will assess and evaluate patient's response and provide education to patient for prescribed medication. RN will report any adverse and/or side effects to prescribing provider.  Therapeutic Interventions: 1 on 1 counseling sessions, Psychoeducation, Medication administration, Evaluate responses to treatment, Monitor vital signs and CBGs as ordered, Perform/monitor CIWA, COWS, AIMS and Fall Risk screenings as ordered, Perform wound care treatments as ordered.  Evaluation of Outcomes: Progressing   LCSW Treatment Plan for Primary Diagnosis: Schizophrenia, acute ([place]) Long Term Goal(s): Safe transition to appropriate next level of care at discharge, Engage patient in therapeutic group addressing interpersonal concerns.  Short Term Goals: Engage patient in aftercare planning with referrals and resources  Therapeutic Interventions: Assess for all discharge needs, 1 to 1 time with Social worker, Explore available resources and support systems, Assess for adequacy in community support network, Educate family and significant other(s) on suicide prevention, Complete Psychosocial Assessment, Interpersonal group therapy.  Evaluation of Outcomes: Met  Return home, follow up [facility]   Progress in Treatment: Attending groups: Yes Participating in groups: Yes Taking medication as prescribed: Yes Toleration medication: Yes, no side effects reported at this time Family/Significant other contact made: [name] [phone] (Mother)  Patient understands diagnosis: Yes AEB Discussing patient identified problems/goals with staff: Yes Medical problems stabilized or resolved: Yes Denies suicidal/homicidal ideation: Yes Issues/concerns per patient self-inventory: None Other: N/A  New problem(s)  identified: None identified at this time.   New Short Term/Long Term Goal(s): "I will take Tylenol even though I am on a vegan diet."  Discharge Plan or Barriers: Upon discharge pt will return home with his mother and will follow up at [facility].   Reason for Continuation of Hospitalization: Disorganization Mood instability Medication stabilization   Estimated Length of Stay: 4/12, adequate for discharge   Attendees: Patient: [name] 04/19/2017  9:40 AM  Physician: Maris Berger, MD 04/19/2017  9:40 AM  Nursing: Sena Hitch, RN 04/19/2017  9:40 AM  RN Care Manager: Lars Pinks, RN 04/19/2017  9:40 AM  Social Worker: Ripley Fraise 04/19/2017  9:40 AM  Recreational Therapist: Winfield Cunas 04/19/2017  9:40 AM  Other: Norberto Sorenson 04/19/2017  9:40 AM  Other:  04/19/2017  9:40 AM    Scribe for Treatment Team:  Roque Lias LCSW 04/19/2017 9:40 AM

## 2017-04-19 NOTE — Progress Notes (Signed)
Recreation Therapy Notes  INPATIENT RECREATION TR PLAN  Patient Details Name: ARLING CERONE MRN: [id number] DOB: 10-02-1990 Today's Date: 04/19/2017  Rec Therapy Plan Is patient appropriate for Therapeutic Recreation?: Yes Treatment times per week: At least three  Estimated Length of Stay: 5-7 days  TR Treatment/Interventions: Group participation (Appropriate participation in Recreation Therapy group tx.)  Discharge Criteria Pt will be discharged from therapy if:: Discharged Treatment plan/goals/alternatives discussed and agreed upon by:: Patient/family  Discharge Summary Short term goals set: See Care Plan  Short term goals met: Complete Progress toward goals comments: Groups attended Which groups?: Goal setting, Communication, Stress management Therapeutic equipment acquired: None  Reason patient discharged from therapy: Discharge from hospital Pt/family agrees with progress & goals achieved: Yes Date patient discharged from therapy: 04/19/17  Ranell Patrick, Recreation Therapy Intern   Ranell Patrick 04/19/2017, 3:00 PM

## 2017-04-19 NOTE — Discharge Summary (Addendum)
Physician Discharge Summary Note  Patient:  [name] is an 27 y.o., male  MRN:  [id number]  DOB:  07/14/1990  Patient phone:  [phone] (home)   Patient address:   [address] [place] [address],   Total Time spent with patient: Greater than 30 minutes  Date of Admission:  04/12/2017 Date of Discharge: 04-19-17  Reason for Admission: Worsening symptoms of psychosis including disorganization, paranoia, delusions, and agitation.  Principal Problem: Schizophrenia, acute [facility])  Discharge Diagnoses: Patient Active Problem List   Diagnosis Date Noted  . Schizophrenia, acute (HCC) [F23] 04/12/2017  . Psychosis (HCC) [F29] 01/17/2017  . 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 Screen for STD (sexually transmitted disease) [Z11.3] 09/14/2016  . Hyperesthesia [R20.3] 09/14/2016  . Testicular pain [N50.819] 09/14/2016  . Burning with urination [R30.0] 09/14/2016  . Left ear pain [H92.02] 09/14/2016  . Muscle strain [T14.8XXA] 11/09/2015  . Strain of back [S39.012A] 11/09/2015  . Back spasm [M62.830] 11/09/2015   Past Psychiatric History: MDD, psychosis.  Past Medical History:  Past Medical History:  Diagnosis Date  . Allergic rhinitis    mostly spring  . Depression   . Strep throat 04/2013   obervation in ED due to high fever, illness    Past Surgical History:  Procedure Laterality Date  . ORCHIOPEXY     bilat, in childhood   Family History:  Family History  Problem Relation Age of Onset  . Other Mother        "tumor of stomach"  . Anemia Mother   . Other Father        unknown  . Diabetes Brother        borderline  . Hypertension Maternal Grandmother   . Cancer Neg Hx   . Stroke Neg Hx   . Heart disease Neg Hx   . Aneurysm Neg Hx    Family Psychiatric  History: See H&P Social History:  Social History   Substance and Sexual Activity  Alcohol Use No     Social History   Substance and  Sexual Activity  Drug Use Yes  . Types: Marijuana    Social History   Socioeconomic History  . Marital status: Single    Spouse name: Not on file  . Number of children: Not on file  . Years of education: Not on file  . Highest education level: Not on file  Occupational History  . Not on file  Social Needs  . Financial resource strain: Not on file  . Food insecurity:    Worry: Not on file    Inability: Not on file  . Transportation needs:    Medical: Not on file    Non-medical: Not on file  Tobacco Use  . Smoking status: Former Games developer  . Smokeless tobacco: Never Used  Substance and Sexual Activity  . Alcohol use: No  . Drug use: Yes    Types: Marijuana  . Sexual activity: Yes    Birth control/protection: None  Lifestyle  . Physical activity:    Days per week: Not on file    Minutes per session: Not on file  . Stress: Not on file  Relationships  . Social connections:    Talks on phone: Not on file    Gets together: Not on file    Attends religious service: Not on file    Active member of club or organization: Not on file    Attends meetings of clubs or  organizations: Not on file    Relationship status: Not on file  Other Topics Concern  . 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   Lives with parents, from [place], high school at [employer], [place] for a while, dropped out.  Wants to go into HVAC work.  Exercise - plays basketball regularly.  Currently trying to find work to [employer] to return to school.   Prior incarceration x 59mo for theft.     Hospital Course: (Per Md's discharge SRA): [name] is a 27 y/o M with history of treatment for psychosis who was admitted from [facility] on IVC placed by his mother due to worsening symptoms of psychosis including disorganization, paranoia, delusions, and agitation. Pt has delusion that a microchip has been placed in the back of his head. Pt has  unpredictable behaviors, tangential responses, and poor insight. He has been roaming around his neighborhood. He has poor adherence to his outpatient treatment regimen. He has recent relevant history of discharge from [facility] on 01/22/17 when he was being treated for similar concerns.Pt was started on oral form of Invega, and dose has been titrated up during his stay. He wastransitioned tolong-acting injectable [name]. He has demonstrated ongoing symptoms of psychosis which have been incrementally improving during his stay.  Besides the use of paliperidone Hinda Glatter Lorelei Pont) IM 156 mg/ml Q 28 days (due again 05-19-17) & Paliperidone tablet (Invega) 9 mg po for mood control, [name] was slso medicated & discharged on; Benztropine 0.5 mg for EPS, Hydroxyzine 25 mg prn for anxiety & Trazodone 50 mg for insomnia. He was enrolled & participated in the group counseling sessions being being offered & held on this unit. He learned coping skills. He presented no other significant health issues that required treatment or monitoring. He tolerated his treatment regimen without any adverse effects or reactions reported.  Today upon his discharge evaluation with the attending psychiatrist, pt shares,"I'm doing pretty good." He denies any specific concerns today, and states that he feels ready to discharge to home. He denies physical complaints, but he does endorse ongoing tactile hallucination of microchip embedded in the back of his skull. He describes it, "It goes up and down, but it's better than the last time I was here." He endorses ongoing AH of "people talking in the background" but he denies that they are bothersome or intrusive. He denies SI/HI/VH. He is sleeping adequately. His appetite is good. He is in agreement to continue his current treatment regimen without changes, and he will receive booster injection of [place] today. He is in agreement to follow up at [facility] for  outpatient follow up. He was able to engage in safety planning including plan to return to [facility] or contact emergency services if he feels unable to maintain his own safety or the safety of others. Pt had no further questions, comments, or concerns.  Upon discharge, [name] presents both mentally & medically stable. He is being discharged & recommended to continue mental health care on an outpatient basis as noted below. He is provided with all the necessary information needed to make this appointments without problem. He received from the [facility] pharmacy, a 7 days woth supply samples of his [facility] discharge medications. He left [facility] in no apparent distress. Transportation per patient's family.  Physical Findings: AIMS: Facial and Oral Movements Muscles of Facial Expression: None, normal Lips and Perioral Area: None, normal Jaw:  None, normal Tongue: None, normal,Extremity Movements Upper (arms, wrists, hands, fingers): None, normal Lower (legs, knees, ankles, toes): None, normal, Trunk Movements Neck, shoulders, hips: None, normal, Overall Severity Severity of abnormal movements (highest score from questions above): None, normal Incapacitation due to abnormal movements: None, normal Patient's awareness of abnormal movements (rate only patient's report): No Awareness, Dental Status Current problems with teeth and/or dentures?: No Does patient usually wear dentures?: No  CIWA:    COWS:     Musculoskeletal: Strength & Muscle Tone: within normal limits Gait & Station: normal Patient leans: N/A  Psychiatric Specialty Exam: Physical Exam  Constitutional: He is oriented to person, place, and time. He appears well-developed.  HENT:  Head: Normocephalic.  Eyes: Pupils are equal, round, and reactive to light.  Neck: Normal range of motion.  Cardiovascular: Normal rate.  Respiratory: Effort normal.  GI: Soft.  Genitourinary:  Genitourinary Comments: Deferred  Musculoskeletal: Normal range of  motion.  Neurological: He is alert and oriented to person, place, and time.  Skin: Skin is warm and dry.    Review of Systems  Constitutional: Negative.   HENT: Negative.   Eyes: Negative.   Respiratory: Negative.   Cardiovascular: Negative.   Gastrointestinal: Negative.   Genitourinary: Negative.   Musculoskeletal: Negative.   Skin: Negative.   Neurological: Negative.   Endo/Heme/Allergies: Negative.   Psychiatric/Behavioral: Positive for depression (Stable) and hallucinations (Hx. Psychosis). Negative for memory loss, substance abuse and suicidal ideas. The patient has insomnia (Stable). The patient is not nervous/anxious.     Blood pressure (!) 141/79, pulse (!) 129, temperature 98 F (36.7 C), temperature source Oral, resp. rate 18, height 6\' 1"  (1.854 m), weight 90.7 kg (200 lb), SpO2 100 %.Body mass index is 26.39 kg/m.  See H&P      Has this patient used any form of tobacco in the last 30 days? (Cigarettes, Smokeless Tobacco, Cigars, and/or Pipes): N/A  Blood Alcohol level:  Lab Results  Component Value Date   ETH <10 04/11/2017   ETH <10 01/16/2017   Metabolic Disorder Labs:  Lab Results  Component Value Date   HGBA1C 5.5 04/13/2017   MPG 111.15 04/13/2017   Lab Results  Component Value Date   PROLACTIN 25.2 (H) 04/13/2017   Lab Results  Component Value Date   CHOL 199 04/13/2017   TRIG 71 04/13/2017   HDL 45 04/13/2017   CHOLHDL 4.4 04/13/2017   VLDL 14 04/13/2017   LDLCALC 140 (H) 04/13/2017   LDLCALC 98 09/14/2016    See Psychiatric Specialty Exam and Suicide Risk Assessment completed by Attending Physician prior to discharge.  Discharge destination:  Home  Is patient on multiple antipsychotic therapies at discharge:  No   Has Patient had three or more failed trials of antipsychotic monotherapy by history:  No  Recommended Plan for Multiple Antipsychotic Therapies: NA  Allergies as of 04/19/2017   No Known Allergies     Medication List     STOP taking these medications   risperiDONE 1 MG tablet Commonly known as:  RISPERDAL     TAKE these medications     Indication  benztropine 0.5 MG tablet Commonly known as:  COGENTIN Take 1 tablet (0.5 mg total) by mouth 2 (two) times daily as needed (EPS). What changed:    when to take this  reasons to take this  additional instructions  Indication:  Extrapyramidal Reaction caused by Medications   hydrOXYzine 25 MG tablet Commonly known as:  ATARAX/VISTARIL Take 1 tablet (25  mg total) by mouth 3 (three) times daily as needed for anxiety.  Indication:  Feeling Anxious   paliperidone 156 MG/ML Susp injection Commonly known as:  INVEGA SUSTENNA Inject 1 mL (156 mg total) into the muscle every 30 (thirty) days. (Due on 05-19-17): For mood control Start taking on:  05/19/2017  Indication:  Mood control   paliperidone 9 MG 24 hr tablet Commonly known as:  INVEGA Take 1 tablet (9 mg total) by mouth daily. For mood control Start taking on:  04/20/2017  Indication:  Mood control   traZODone 100 MG tablet Commonly known as:  DESYREL Take 1 tablet (100 mg total) by mouth at bedtime as needed for sleep. What changed:    medication strength  how much to take  Indication:  Trouble Sleeping      Follow-up Information    [facility] on 04/22/2017.   Why:  Follow up is schedueld for Monday at 1:15 Contact information: [address] [place] [address] [phone]          Follow-up recommendations: Activity:  As tolerated Diet: As recommended by your primary care doctor. Keep all scheduled follow-up appointments as recommended.    Comments: Patient is instructed prior to discharge to: Take all medications as prescribed by his/her mental healthcare provider. Report any adverse effects and or reactions from the medicines to his/her outpatient provider promptly. Patient has been instructed & cautioned: To not engage in alcohol and  or illegal drug use while on prescription medicines. In the event of worsening symptoms, patient is instructed to call the crisis hotline, 911 and or go to the nearest ED for appropriate evaluation and treatment of symptoms. To follow-up with his/her primary care provider for your other medical issues, concerns and or health care needs.   Signed: Armandina StammerAgnes Nwoko, NP, PMHNP, FNP-BC 04/19/2017, 10:34 AM   Patient seen, Suicide Assessment Completed.  Disposition Plan Reviewed

## 2017-12-07 ENCOUNTER — Encounter (HOSPITAL_COMMUNITY): Payer: Self-pay | Admitting: Emergency Medicine

## 2017-12-07 ENCOUNTER — Emergency Department (HOSPITAL_COMMUNITY)
Admission: EM | Admit: 2017-12-07 | Discharge: 2017-12-08 | Disposition: A | Discharge status: Other Acute Inpatient Hospital | Payer: Medicaid Other | Attending: Emergency Medicine | Admitting: Emergency Medicine

## 2017-12-07 DIAGNOSIS — Z79899 Other long term (current) drug therapy: Secondary | ICD-10-CM | POA: Insufficient documentation

## 2017-12-07 DIAGNOSIS — F333 Major depressive disorder, recurrent, severe with psychotic symptoms: Secondary | ICD-10-CM | POA: Insufficient documentation

## 2017-12-07 DIAGNOSIS — Z87891 Personal history of nicotine dependence: Secondary | ICD-10-CM | POA: Insufficient documentation

## 2017-12-07 DIAGNOSIS — R440 Auditory hallucinations: Secondary | ICD-10-CM | POA: Insufficient documentation

## 2017-12-07 DIAGNOSIS — F209 Schizophrenia, unspecified: Secondary | ICD-10-CM | POA: Insufficient documentation

## 2017-12-07 DIAGNOSIS — Z9119 Patient's noncompliance with other medical treatment and regimen: Secondary | ICD-10-CM | POA: Insufficient documentation

## 2017-12-07 LAB — CBC WITH DIFFERENTIAL/PLATELET
Abs Immature Granulocytes: 0.02 10*3/uL (ref 0.00–0.07)
BASOS ABS: 0 10*3/uL (ref 0.0–0.1)
BASOS PCT: 0 %
EOS ABS: 0.1 10*3/uL (ref 0.0–0.5)
Eosinophils Relative: 1 %
HCT: 40.8 % (ref 39.0–52.0)
Hemoglobin: 13.6 g/dL (ref 13.0–17.0)
IMMATURE GRANULOCYTES: 0 %
Lymphocytes Relative: 26 %
Lymphs Abs: 1.9 10*3/uL (ref 0.7–4.0)
MCH: 28.9 pg (ref 26.0–34.0)
MCHC: 33.3 g/dL (ref 30.0–36.0)
MCV: 86.8 fL (ref 80.0–100.0)
Monocytes Absolute: 0.7 10*3/uL (ref 0.1–1.0)
Monocytes Relative: 10 %
NEUTROS PCT: 63 %
Neutro Abs: 4.5 10*3/uL (ref 1.7–7.7)
PLATELETS: 322 10*3/uL (ref 150–400)
RBC: 4.7 MIL/uL (ref 4.22–5.81)
RDW: 12 % (ref 11.5–15.5)
WBC: 7.2 10*3/uL (ref 4.0–10.5)
nRBC: 0 % (ref 0.0–0.2)

## 2017-12-07 LAB — COMPREHENSIVE METABOLIC PANEL
ALK PHOS: 61 U/L (ref 38–126)
ALT: 40 U/L (ref 0–44)
AST: 45 U/L — ABNORMAL HIGH (ref 15–41)
Albumin: 5 g/dL (ref 3.5–5.0)
Anion gap: 13 (ref 5–15)
BUN: 11 mg/dL (ref 6–20)
CALCIUM: 10 mg/dL (ref 8.9–10.3)
CO2: 23 mmol/L (ref 22–32)
CREATININE: 0.92 mg/dL (ref 0.61–1.24)
Chloride: 103 mmol/L (ref 98–111)
GFR calc non Af Amer: >60 mL/min (ref >60)
GLUCOSE: 90 mg/dL (ref 70–99)
Potassium: 3.4 mmol/L — ABNORMAL LOW (ref 3.5–5.1)
SODIUM: 139 mmol/L (ref 135–145)
Total Bilirubin: 1 mg/dL (ref 0.3–1.2)
Total Protein: 7.8 g/dL (ref 6.5–8.1)

## 2017-12-07 LAB — RAPID URINE DRUG SCREEN, HOSP PERFORMED
Amphetamines: NOT DETECTED
BARBITURATES: NOT DETECTED
Benzodiazepines: NOT DETECTED
Cocaine: NOT DETECTED
Opiates: NOT DETECTED
TETRAHYDROCANNABINOL: NOT DETECTED

## 2017-12-07 LAB — ACETAMINOPHEN LEVEL

## 2017-12-07 LAB — SALICYLATE LEVEL: Salicylate Lvl: <7 mg/dL (ref 2.8–30.0)

## 2017-12-07 LAB — ETHANOL: Alcohol, Ethyl (B): <10 mg/dL (ref <10)

## 2017-12-07 MED ORDER — ACETAMINOPHEN 325 MG PO TABS
650.0000 mg | ORAL_TABLET | ORAL | Status: DC | PRN
  Filled 2017-12-07: qty 2

## 2017-12-07 MED ORDER — ACETAMINOPHEN 500 MG PO TABS
1000.0000 mg | ORAL_TABLET | Freq: Once | ORAL | Status: DC
  Filled 2017-12-07: qty 2

## 2017-12-07 MED ORDER — HALOPERIDOL LACTATE 5 MG/ML IJ SOLN
5.0000 mg | Freq: Once | INTRAMUSCULAR | Status: AC
  Administered 2017-12-07: 5 mg via INTRAMUSCULAR
  Filled 2017-12-07: qty 1

## 2017-12-07 MED ORDER — LORAZEPAM 2 MG/ML IJ SOLN
1.0000 mg | Freq: Once | INTRAMUSCULAR | Status: AC
  Administered 2017-12-07: 1 mg via INTRAMUSCULAR
  Filled 2017-12-07: qty 1

## 2017-12-07 NOTE — ED Triage Notes (Signed)
Pt. Came in with mother from home with complaint of anxiety and depression.  Denied SI/HI. Denied pain. Cooperative.

## 2017-12-07 NOTE — ED Notes (Signed)
TTS on going. 

## 2017-12-07 NOTE — ED Provider Notes (Signed)
[facility]-EMERGENCY DEPT Provider Note   CSN: [id number] Arrival date & time: 12/07/17  1934     History   Chief Complaint Chief Complaint  Patient presents with  . Medical Clearance    HPI [name] is a 27 y.o. male with a history of schizophrenia and allergic rhinitis who presents to the emergency department accompanied by his mother with a chief complaint of behavior change.  The patient's mother reports that neighbors called the police on the patient tonight after he was found to be outside wearing shorts and running and yelling in the rain.   She reports concern for worsening behavior changes.  She reports that he is scheduled to see psychiatry in early December, but she is concerned that with his behavior changes that "he won't make it that long."   The patient's mother reports the patient has not slept in probably 3 or 4 days.  The patient states that it is been about 2.  When asked why he was not able to sleep, he states that it was because he was "playing basketball or running track" forming another activity outside.  She reports he has barely been eating.   Although the patient denies auditory or visual hallucinations, the patient's mother reports he has been having auditory hallucinations and has said "shhh mom, stop talking. I can't hear the voices."  She denies visual hallucination.  When asked how the voices sound or what they are saying to him, he instead asked me to listen to his heart and lungs and will not answer the question.  The patient states his only complaint is that he is having worsening depression and anxiety for the last 6 months.  The patient's mother reports that he is supposed to be taking daily medication, but has been noncompliant.  The patient states his only medication is water.  He denies SI or HI.  He lives at home with his mother.  His only other complaint is that he slipped and fell and landed on his right side  prior to arrival.  He has a cut on his left second finger, but he is refusing to let me remove the bandage and evaluate the wound.  He denies hitting his head, LOC, nausea, or emesis.  Level 5 caveat secondary to psychiatric disorder.  The history is provided by the patient. No language interpreter was used.    Past Medical History:  Diagnosis Date  . Allergic rhinitis    mostly spring  . Depression   . Strep throat 04/2013   obervation in ED due to high fever, illness    Patient Active Problem List   Diagnosis Date Noted  . Schizophrenia, acute (HCC) 04/12/2017  . Psychosis (HCC) 01/17/2017  . MDD (major depressive disorder), recurrent, severe, with psychosis (HCC) 01/16/2017  . Screen for STD (sexually transmitted disease) 09/14/2016  . Hyperesthesia 09/14/2016  . Testicular pain 09/14/2016  . Burning with urination 09/14/2016  . Left ear pain 09/14/2016  . Muscle strain 11/09/2015  . Strain of back 11/09/2015  . Back spasm 11/09/2015    Past Surgical History:  Procedure Laterality Date  . ORCHIOPEXY     bilat, in childhood        Home Medications    Prior to Admission medications   Medication Sig Start Date End Date Taking? Authorizing Provider  paliperidone (INVEGA SUSTENNA) 156 MG/ML SUSP injection Inject 1 mL (156 mg total) into the muscle every 30 (thirty) days. (Due on 05-19-17): For  mood control Patient taking differently: Inject 156 mg into the muscle every 30 (thirty) days. For mood control 05/19/17  Yes Nwoko, Nicole KindredAgnes I, NP  paliperidone (INVEGA) 9 MG 24 hr tablet Take 1 tablet (9 mg total) by mouth daily. For mood control 04/20/17  Yes Armandina StammerNwoko, Agnes I, NP  traZODone (DESYREL) 100 MG tablet Take 1 tablet (100 mg total) by mouth at bedtime as needed for sleep. 04/19/17  Yes Armandina StammerNwoko, Agnes I, NP  benztropine (COGENTIN) 0.5 MG tablet Take 1 tablet (0.5 mg total) by mouth 2 (two) times daily as needed (EPS). Patient not taking: Reported on 12/07/2017 04/19/17   Armandina StammerNwoko,  Agnes I, NP  hydrOXYzine (ATARAX/VISTARIL) 25 MG tablet Take 1 tablet (25 mg total) by mouth 3 (three) times daily as needed for anxiety. Patient not taking: Reported on 12/07/2017 04/19/17   Sanjuana KavaNwoko, Agnes I, NP    Family History Family History  Problem Relation Age of Onset  . Other Mother        "tumor of stomach"  . Anemia Mother   . Other Father        unknown  . Diabetes Brother        borderline  . Hypertension Maternal Grandmother   . Cancer Neg Hx   . Stroke Neg Hx   . Heart disease Neg Hx   . Aneurysm Neg Hx     Social History Social History   Tobacco Use  . Smoking status: Former Games developermoker  . Smokeless tobacco: Never Used  Substance Use Topics  . Alcohol use: No  . Drug use: Yes    Types: Marijuana     Allergies   Patient has no known allergies.   Review of Systems Review of Systems  Unable to perform ROS: Psychiatric disorder  Musculoskeletal: Positive for back pain.  Psychiatric/Behavioral: Positive for agitation, hallucinations and sleep disturbance. The patient is nervous/anxious.      Physical Exam Updated Vital Signs BP (!) 124/54 (BP Location: Left Arm)   Pulse 91   Temp 98.2 F (36.8 C) (Oral)   Resp 18   SpO2 95%   Physical Exam  Constitutional: He appears well-developed.  HENT:  Head: Normocephalic.  Eyes: Conjunctivae are normal.  Neck: Neck supple.  Cardiovascular: Normal rate, regular rhythm, normal heart sounds and intact distal pulses. Exam reveals no gallop and no friction rub.  No murmur heard. Pulmonary/Chest: Effort normal and breath sounds normal. No stridor. No respiratory distress. He has no wheezes. He has no rales. He exhibits no tenderness.  Abdominal: Soft. Bowel sounds are normal. He exhibits no distension and no mass. There is no tenderness. There is no rebound and no guarding. No hernia.  Musculoskeletal:  No tenderness to palpation to the cervical, thoracic, or lumbar spinous processes or bilateral paraspinal  muscles.  No overlying ecchymosis, abrasions, or lacerations to the skin of the back.  Good strength against resistance of the bilateral lower extremities.  Sensation is intact and equal.  DP pulses are 2+ and symmetric.  Neurological: He is alert.  Skin: Skin is warm and dry.  Psychiatric: His affect is angry and labile. His speech is rapid and/or pressured. He is agitated. Thought content is not paranoid and not delusional. Cognition and memory are impaired. He expresses impulsivity and inappropriate judgment. He expresses no homicidal and no suicidal ideation. He expresses no suicidal plans and no homicidal plans.  On initial examination, patient was noted to have blunted affect.  However, was notified by nursing staff about  10 minutes after initial assessment of the patient was yelling and acting more agitated.  On reassessment, the patient appeared to be agitated and yelling and speech was much more tangential. He is inattentive.  Nursing note and vitals reviewed.    ED Treatments / Results  Labs (all labs ordered are listed, but only abnormal results are displayed) Labs Reviewed  COMPREHENSIVE METABOLIC PANEL - Abnormal; Notable for the following components:      Result Value   Potassium 3.4 (*)    AST 45 (*)    All other components within normal limits  ACETAMINOPHEN LEVEL - Abnormal; Notable for the following components:   Acetaminophen (Tylenol), Serum <10 (*)    All other components within normal limits  ETHANOL  RAPID URINE DRUG SCREEN, HOSP PERFORMED  CBC WITH DIFFERENTIAL/PLATELET  SALICYLATE LEVEL    EKG None  Radiology No results found.  Procedures Procedures (including critical care time)  Medications Ordered in ED Medications  acetaminophen (TYLENOL) tablet 1,000 mg (1,000 mg Oral Refused 12/07/17 2106)  acetaminophen (TYLENOL) tablet 650 mg (has no administration in time range)  LORazepam (ATIVAN) injection 1 mg (1 mg Intramuscular Given 12/07/17 2134)    haloperidol lactate (HALDOL) injection 5 mg (5 mg Intramuscular Given 12/07/17 2227)     Initial Impression / Assessment and Plan / ED Course  I have reviewed the triage vital signs and the nursing notes.  Pertinent labs & imaging results that were available during my care of the patient were reviewed by me and considered in my medical decision making (see chart for details).     27 year old male with a history of schizophrenia and allergic rhinitis presenting with worsening erratic behavior and auditory hallucinations.  No SI, HI, or visual hallucinations.  The patient's mother reports that the patient has not slept in days and prior to arrival in the ED tonight police were called by neighbors after the patient was found to be running outside in the rain and a pair of shorts and screaming and yelling.  The patient states he had a fall prior to arrival.  On exam, no midline tenderness to the cervical, thoracic, lumbar spinous processes.  Back exam is unremarkable.  No numbness or weakness.  He is ambulate without difficulty.  Tylenol given for pain control.  Imaging is not warranted at this time.  Pt medically cleared at this time. Psych hold orders.  The patient is not currently on any home medications.. TTS consulted and inpatient admission is recommended; please see psych team notes for further documentation of care/dispo. Pt stable at time of med clearance.    Final Clinical Impressions(s) / ED Diagnoses   Final diagnoses:  Schizophrenia, unspecified type [facility])    ED Discharge Orders    None       Manny Vitolo A, PA-C 12/08/17 0101    Geoffery Lyons, MD 12/08/17 1557

## 2017-12-07 NOTE — BH Assessment (Addendum)
Tele Assessment Note   Patient Name: [name] MRN: [id number] Referring Physician: Frederik Pear, PA-C Location of Patient: [facility] ED, [phone] Location of Provider: Behavioral Health TTS Department  [name] is an 27 y.o. single male who presents unaccompanied to [facility] ED reporting symptoms of depression. Pt has a history of schizophrenia and appears to have disorganized thought process. Pt's mother reported to EDP that neighbors called the police on the patient tonight after he was found to be outside wearing shorts and running and yelling in the rain. She reports concern for worsening behavior changes. She reports that he is scheduled to see psychiatry in early December, but she is concerned that with his behavior changes that "he won't make it that long." Pt's mother reports the patient has not slept in probably 3 or 4 days.  The patient states that it is been about 2.  When asked why he was not able to sleep, he states that it was because he was "playing basketball or running track" forming another activity outside.  She reports he has barely been eating.   Pt is a poor historian and frequently give irrelevant or tangential responses to questions. Pt says he has felt depressed for the past two years. He acknowledges symptoms including loss of interest in usual activities, fatigue, irritability and feelings of hopelessness. He acknowledges auditory and visual hallucinations but cannot describe what he is experiencing. He denies current suicidal ideation. He denies current homicidal ideation but acknowledges a history of aggressive behavior. Pt denies alcohol or other substance use. When asked if Pt is experiencing stressors, Pt responds "teeth" but then denies dental pain.   Pt was last inpatient at [facility] in April 2019. Pt says he has a psychiatrist but cannot give a name. Pt is not taking medication as prescribed and told the EDP his only medication is water.   Pt is  dressed in hospital scrubs, alert and oriented person, place and time. Pt speaks in a clear tone, at moderate volume and normal pace. Motor behavior appears restless. Eye contact is fair. Pt's mood is anxious and affect is irritable. Thought process is disorganized. Pt appears distracted. Pt stopped the assessment abruptly and said he was no longer answering questions.   Diagnosis: F20.9 Schizophrenia  Past Medical History:  Past Medical History:  Diagnosis Date  . Allergic rhinitis    mostly spring  . Depression   . Strep throat 04/2013   obervation in ED due to high fever, illness    Past Surgical History:  Procedure Laterality Date  . ORCHIOPEXY     bilat, in childhood    Family History:  Family History  Problem Relation Age of Onset  . Other Mother        "tumor of stomach"  . Anemia Mother   . Other Father        unknown  . Diabetes Brother        borderline  . Hypertension Maternal Grandmother   . Cancer Neg Hx   . Stroke Neg Hx   . Heart disease Neg Hx   . Aneurysm Neg Hx     Social History:  reports that he has quit smoking. He has never used smokeless tobacco. He reports that he has current or past drug history. Drug: Marijuana. He reports that he does not drink alcohol.  Additional Social History:  Alcohol / Drug Use Pain Medications: See MAR Prescriptions: See MAR Over the Counter: See MAR History of  alcohol / drug use?: No history of alcohol / drug abuse Longest period of sobriety (when/how long): NA  CIWA: CIWA-Ar BP: (!) 137/102 Pulse Rate: (!) 125 COWS:    Allergies: No Known Allergies  Home Medications:  (Not in a hospital admission)  OB/GYN Status:  No LMP for male patient.  General Assessment Data Location of Assessment: [facility] ED TTS Assessment: In system Is this a Tele or Face-to-Face Assessment?: Tele Assessment Is this an Initial Assessment or a Re-assessment for this encounter?: Initial Assessment Patient Accompanied by::  N/A(Alone) Language Other than English: No Living Arrangements: Other (Comment)(Lives with mother) What gender do you identify as?: Male Marital status: Single Maiden name: NA Pregnancy Status: No Living Arrangements: Parent Can pt return to current living arrangement?: Yes Admission Status: Voluntary Is patient capable of signing voluntary admission?: Yes Referral Source: Self/Family/Friend Insurance type: Medicaid     Crisis Care Plan Living Arrangements: Parent Legal Guardian: Other:(Self) Name of Psychiatrist: Pt doesn't know name Name of Therapist: None  Education Status Is patient currently in school?: No Is the patient employed, unemployed or receiving disability?: Unemployed  Risk to self with the past 6 months Suicidal Ideation: No Has patient been a risk to self within the past 6 months prior to admission? : No Suicidal Intent: No Has patient had any suicidal intent within the past 6 months prior to admission? : No Is patient at risk for suicide?: No Suicidal Plan?: No Has patient had any suicidal plan within the past 6 months prior to admission? : No Access to Means: No What has been your use of drugs/alcohol within the last 12 months?: Pt denies Previous Attempts/Gestures: No How many times?: 0 Other Self Harm Risks: None Triggers for Past Attempts: None known Intentional Self Injurious Behavior: None Family Suicide History: Unknown Recent stressful life event(s): Other (Comment)(Pt cannot identify stressors) Persecutory voices/beliefs?: No Depression: Yes Depression Symptoms: Despondent, Insomnia, Isolating, Fatigue, Feeling angry/irritable Substance abuse history and/or treatment for substance abuse?: No Suicide prevention information given to non-admitted patients: Not applicable  Risk to Others within the past 6 months Homicidal Ideation: No Does patient have any lifetime risk of violence toward others beyond the six months prior to admission? :  No Thoughts of Harm to Others: No Current Homicidal Intent: No Current Homicidal Plan: No Access to Homicidal Means: No Identified Victim: None History of harm to others?: No Assessment of Violence: None Noted Violent Behavior Description: Pt denies Does patient have access to weapons?: No Criminal Charges Pending?: No Does patient have a court date: No Is patient on probation?: No  Psychosis Hallucinations: Auditory, Visual(Pt cannot describe) Delusions: Unspecified  Mental Status Report Appearance/Hygiene: In scrubs Eye Contact: Fair Motor Activity: Unremarkable Speech: Tangential Level of Consciousness: Alert Mood: Anxious Affect: Irritable Anxiety Level: Minimal Thought Processes: Irrelevant, Tangential Judgement: Impaired Orientation: Person, Place, Time Obsessive Compulsive Thoughts/Behaviors: None  Cognitive Functioning Concentration: Decreased Memory: Unable to Assess Is patient IDD: No Insight: Poor Impulse Control: Poor Appetite: Fair Have you had any weight changes? : No Change Sleep: Decreased Total Hours of Sleep: 0 Vegetative Symptoms: None  ADLScreening [facility]) Patient's cognitive ability adequate to safely complete daily activities?: Yes Patient able to express need for assistance with ADLs?: Yes Independently performs ADLs?: Yes (appropriate for developmental age)  Prior Inpatient Therapy Prior Inpatient Therapy: Yes Prior Therapy Dates: 04/2017, multiple admits [facility] Reason for Treatment: Schizophrenia  Prior Outpatient Therapy Prior Outpatient Therapy: Yes Prior Therapy Dates: Unknown Prior  Therapy Facilty/Provider(s): Unknown Reason for Treatment: Schizophrenia Does patient have an ACCT team?: No Does patient have Intensive In-House Services?  : No Does patient have [facility] services? : No Does patient have P4CC services?: No  ADL Screening (condition at time of  admission) Patient's cognitive ability adequate to safely complete daily activities?: Yes Is the patient deaf or have difficulty hearing?: No Does the patient have difficulty seeing, even when wearing glasses/contacts?: No Does the patient have difficulty concentrating, remembering, or making decisions?: No Patient able to express need for assistance with ADLs?: Yes Does the patient have difficulty dressing or bathing?: No Independently performs ADLs?: Yes (appropriate for developmental age) Does the patient have difficulty walking or climbing stairs?: No Weakness of Legs: None Weakness of Arms/Hands: None  Home Assistive Devices/Equipment Home Assistive Devices/Equipment: None    Abuse/Neglect Assessment (Assessment to be complete while patient is alone) Abuse/Neglect Assessment Can Be Completed: Yes Physical Abuse: Denies Verbal Abuse: Denies Sexual Abuse: Denies Exploitation of patient/patient's resources: Denies Self-Neglect: Denies     Merchant navy officer (For Healthcare) Does Patient Have a Medical Advance Directive?: No Would patient like information on creating a medical advance directive?: No - Patient declined          Disposition: Binnie Rail, [facility], confirmed a bed will be available after 0800 on 12/08/17. Gave clinical report to Nira Conn, FNP who said Pt meets criteria for inpatient psychiatric treatment and accepted Pt to the service of Dr. Malvin Johns. Notified Mia McDonald, PA-C and Merlene Morse, RN of acceptance.  Disposition Initial Assessment Completed for this Encounter: Yes  This service was provided via telemedicine using a 2-way, interactive audio and video technology.  Names of all persons participating in this telemedicine service and their role in this encounter. Name: [name] Role: Patient  Name: Shela Commons, [facility] Role: TTS counselor          Patsy Baltimore, Harlin Rain 12/07/2017 9:15 PM

## 2017-12-07 NOTE — ED Notes (Signed)
Two labeled [id number]

## 2017-12-07 NOTE — ED Notes (Signed)
Pt. Became hyperactive, dancing, singing/rapping and getting loud. Uncooperartive at times. kept monitored. Refused tylenol,denied pain.

## 2017-12-08 ENCOUNTER — Other Ambulatory Visit: Payer: Self-pay

## 2017-12-08 ENCOUNTER — Encounter (HOSPITAL_COMMUNITY): Payer: Self-pay | Admitting: Behavioral Health

## 2017-12-08 ENCOUNTER — Inpatient Hospital Stay (HOSPITAL_COMMUNITY)
Admission: AD | Admit: 2017-12-08 | Discharge: 2017-12-12 | DRG: 885 | Disposition: A | Discharge status: Home / Self Care | Payer: Medicaid Other | Source: Intra-hospital | Attending: Emergency Medicine | Admitting: Emergency Medicine

## 2017-12-08 DIAGNOSIS — Z8249 Family history of ischemic heart disease and other diseases of the circulatory system: Secondary | ICD-10-CM | POA: Diagnosis not present

## 2017-12-08 DIAGNOSIS — Z818 Family history of other mental and behavioral disorders: Secondary | ICD-10-CM

## 2017-12-08 DIAGNOSIS — G4700 Insomnia, unspecified: Secondary | ICD-10-CM | POA: Diagnosis not present

## 2017-12-08 DIAGNOSIS — F17210 Nicotine dependence, cigarettes, uncomplicated: Secondary | ICD-10-CM | POA: Diagnosis present

## 2017-12-08 DIAGNOSIS — E876 Hypokalemia: Secondary | ICD-10-CM | POA: Diagnosis not present

## 2017-12-08 DIAGNOSIS — F23 Brief psychotic disorder: Secondary | ICD-10-CM | POA: Diagnosis present

## 2017-12-08 DIAGNOSIS — Z833 Family history of diabetes mellitus: Secondary | ICD-10-CM

## 2017-12-08 DIAGNOSIS — Z832 Family history of diseases of the blood and blood-forming organs and certain disorders involving the immune mechanism: Secondary | ICD-10-CM

## 2017-12-08 DIAGNOSIS — F209 Schizophrenia, unspecified: Secondary | ICD-10-CM | POA: Diagnosis not present

## 2017-12-08 HISTORY — DX: Schizophrenia, unspecified: F20.9

## 2017-12-08 LAB — CBC WITH DIFFERENTIAL/PLATELET
Abs Immature Granulocytes: 0.01 10*3/uL (ref 0.00–0.07)
Basophils Absolute: 0 10*3/uL (ref 0.0–0.1)
Basophils Relative: 0 %
EOS ABS: 0.2 10*3/uL (ref 0.0–0.5)
Eosinophils Relative: 4 %
HCT: 42.9 % (ref 39.0–52.0)
Hemoglobin: 13.8 g/dL (ref 13.0–17.0)
Immature Granulocytes: 0 %
Lymphocytes Relative: 46 %
Lymphs Abs: 2.5 10*3/uL (ref 0.7–4.0)
MCH: 28.8 pg (ref 26.0–34.0)
MCHC: 32.2 g/dL (ref 30.0–36.0)
MCV: 89.6 fL (ref 80.0–100.0)
Monocytes Absolute: 0.6 10*3/uL (ref 0.1–1.0)
Monocytes Relative: 12 %
Neutro Abs: 2 10*3/uL (ref 1.7–7.7)
Neutrophils Relative %: 38 %
Platelets: 284 10*3/uL (ref 150–400)
RBC: 4.79 MIL/uL (ref 4.22–5.81)
RDW: 12.1 % (ref 11.5–15.5)
WBC: 5.3 10*3/uL (ref 4.0–10.5)
nRBC: 0 % (ref 0.0–0.2)

## 2017-12-08 LAB — MAGNESIUM: Magnesium: 2.3 mg/dL (ref 1.7–2.4)

## 2017-12-08 LAB — BASIC METABOLIC PANEL
ANION GAP: 10 (ref 5–15)
BUN: 12 mg/dL (ref 6–20)
CO2: 25 mmol/L (ref 22–32)
Calcium: 9 mg/dL (ref 8.9–10.3)
Chloride: 103 mmol/L (ref 98–111)
Creatinine, Ser: 1.1 mg/dL (ref 0.61–1.24)
GFR calc Af Amer: >60 mL/min (ref >60)
GFR calc non Af Amer: >60 mL/min (ref >60)
Glucose, Bld: 87 mg/dL (ref 70–99)
Potassium: 3.2 mmol/L — ABNORMAL LOW (ref 3.5–5.1)
Sodium: 138 mmol/L (ref 135–145)

## 2017-12-08 MED ORDER — LORAZEPAM 2 MG/ML IJ SOLN: 2.0000 mg | Freq: Four times a day (QID) | INTRAMUSCULAR | Status: DC | PRN

## 2017-12-08 MED ORDER — POTASSIUM CHLORIDE CRYS ER 20 MEQ PO TBCR
40.0000 meq | EXTENDED_RELEASE_TABLET | Freq: Once | ORAL | Status: AC
  Administered 2017-12-08: 40 meq via ORAL
  Filled 2017-12-08 (×2): qty 2

## 2017-12-08 MED ORDER — TRAZODONE HCL 100 MG PO TABS: 100.0000 mg | ORAL_TABLET | Freq: Every evening | ORAL | Status: DC | PRN

## 2017-12-08 MED ORDER — LORAZEPAM 1 MG PO TABS: 2.0000 mg | ORAL_TABLET | Freq: Four times a day (QID) | ORAL | Status: DC | PRN

## 2017-12-08 MED ORDER — TRAZODONE HCL 50 MG PO TABS: 50.0000 mg | ORAL_TABLET | Freq: Every evening | ORAL | Status: DC | PRN

## 2017-12-08 MED ORDER — OLANZAPINE 5 MG PO TBDP
5.0000 mg | ORAL_TABLET | Freq: Three times a day (TID) | ORAL | Status: DC | PRN
  Administered 2017-12-08: 5 mg via ORAL
  Filled 2017-12-08: qty 1

## 2017-12-08 MED ORDER — PALIPERIDONE ER 6 MG PO TB24
6.0000 mg | ORAL_TABLET | Freq: Every day | ORAL | Status: DC
  Filled 2017-12-08 (×2): qty 1

## 2017-12-08 MED ORDER — ALUM & MAG HYDROXIDE-SIMETH 200-200-20 MG/5ML PO SUSP: 30.0000 mL | ORAL | Status: DC | PRN

## 2017-12-08 MED ORDER — MAGNESIUM HYDROXIDE 400 MG/5ML PO SUSP: 30.0000 mL | Freq: Every day | ORAL | Status: DC | PRN

## 2017-12-08 MED ORDER — OLANZAPINE 10 MG IM SOLR: 5.0000 mg | Freq: Three times a day (TID) | INTRAMUSCULAR | Status: DC | PRN

## 2017-12-08 MED ORDER — OLANZAPINE 5 MG PO TBDP: 5.0000 mg | ORAL_TABLET | Freq: Three times a day (TID) | ORAL | Status: DC | PRN

## 2017-12-08 MED ORDER — NICOTINE 21 MG/24HR TD PT24: 21.0000 mg | MEDICATED_PATCH | Freq: Every day | TRANSDERMAL | Status: DC

## 2017-12-08 MED ORDER — TRAZODONE HCL 100 MG PO TABS
100.0000 mg | ORAL_TABLET | Freq: Every evening | ORAL | Status: DC | PRN
  Filled 2017-12-08 (×4): qty 1

## 2017-12-08 MED ORDER — ACETAMINOPHEN 325 MG PO TABS: 650.0000 mg | ORAL_TABLET | Freq: Four times a day (QID) | ORAL | Status: DC | PRN

## 2017-12-08 MED ORDER — PALIPERIDONE ER 6 MG PO TB24
9.0000 mg | ORAL_TABLET | Freq: Every day | ORAL | Status: DC
  Administered 2017-12-08: 9 mg via ORAL
  Filled 2017-12-08 (×2): qty 1

## 2017-12-08 MED ORDER — NICOTINE 21 MG/24HR TD PT24
21.0000 mg | MEDICATED_PATCH | Freq: Every day | TRANSDERMAL | Status: DC
  Filled 2017-12-08 (×6): qty 1

## 2017-12-08 NOTE — [facility] Suicide Risk Assessment (Addendum)
[facility] Admission Suicide Risk Assessment   Nursing information obtained from:  Patient Demographic factors:  Male, Low socioeconomic status, Unemployed Current Mental Status:  NA Loss Factors:  NA Historical Factors:  Impulsivity Risk Reduction Factors:  Sense of responsibility to family, Positive social support  Total Time spent with patient: 45 minutes Principal Problem: Schizophrenia, acute (HCC) Diagnosis:  Principal Problem:   Schizophrenia, acute (HCC)  Subjective Data:   Continued Clinical Symptoms:  Alcohol Use Disorder Identification Test Final Score (AUDIT): 0 The "Alcohol Use Disorders Identification Test", Guidelines for Use in Primary Care, Second Edition.  World Science writer [facility]). Score between 0-7:  no or low risk or alcohol related problems. Score between 8-15:  moderate risk of alcohol related problems. Score between 16-19:  high risk of alcohol related problems. Score 20 or above:  warrants further diagnostic evaluation for alcohol dependence and treatment.   CLINICAL FACTORS:  27, single, no children, lives with mother, unemployed . Presented to ED with his mother. Patient is a limited historian at this time, reports he came to ED because he fell and has felt tired . Patient states he has a microchip in the back of his head and that if affects his behavior. He denies hallucinations. At this time presents calm, sedated.  As per chart notes, his mother reported neighbors had contacted police because patient was presenting with disorganized behavior, internally preoccupied/hallucinations, found outside wearing shorts and running and yelling in the rain, and not sleeping or eating regularly  x several days. In ED was initially exhibiting agitated, disorganized behaviors.  Patient reports he has been taking medications but is unable to identify  when he last took them. As per chart notes, has been on oral Invega at 9 mgrs QDAY and on  [place] IM monthly. (At  this time it is unclear when patient last received this medication.) He denies drug or alcohol abuse and admission BAL/UDS are  negative. He has a history of psychiatric illness and had prior psychiatric admission to [facility] in April 2019. At the time was admitted due to  worsening psychosis, disorganized behaviors, agitation, with similar  delusion of having an implanted microchip. He denies medical illnesses .  Dx- Schizophrenia by history  Plan- Inpatient admission Continue Invega at 6 mgrs QHS for psychosis. Treatment team will work on determining most recent [place] dose to establish next projected administration date.  Agitation protocol- Zyprexa/ Ativan Change Trazodone to 50 mgrs QHS PRN  Check Lipid Panel and HgbA1C, and recheck EKG to monitor QTc          Musculoskeletal: Strength & Muscle Tone: within normal limits Gait & Station: normal Patient leans: N/A  Psychiatric Specialty Exam: Physical Exam  ROS denies headache, denies chest pain, denies shortness of breath, no vomiting  Blood pressure (!) 133/91, pulse (!) 107, temperature 98 F (36.7 C), temperature source Oral, resp. rate 18, height 6\' 1"  (1.854 m), weight 95.7 kg, SpO2 100 %.Body mass index is 27.84 kg/m.  General Appearance: Fairly Groomed  Eye Contact:  Minimal  Speech:  Slow/minimal- responds to questions with short phrases or monosyllables   Volume:  Decreased  Mood:  states mood is "OK" , denies depression  Affect:  blunted   Thought Process:  Slowed, circumsntatial  Orientation:  Presents sedated , but easily alertable by calling name, falls asleep at times during session.   Thought Content:  denies hallucinaitons,at this time does not appear internally preoccupied. (+) delusion - states has microchip implanted  on his head which affects his behaivor  Suicidal Thoughts:  No currently denies suicidal ideations, denies homicidal or violent ideations  Homicidal Thoughts:  No  Memory:  recent and  remote fair   Judgement:  Impaired  Insight:  Lacking  Psychomotor Activity:  Decreased- no current agitation or restlessness   Concentration:  Concentration: Fair and Attention Span: Fair  Recall:  Fis[employer]Fair  Fund of Knowledge:  Fair  Language:  Fair  Akathisia:  Negative  Handed:  Right  AIMS (if indicated):     Assets:  Desire for Improvement Resilience  ADL's: fair   Cognition: difficult to assess at this time due to minimal answers   Sleep:         COGNITIVE FEATURES THAT CONTRIBUTE TO RISK:  Closed-mindedness and Loss of executive function    SUICIDE RISK:   Moderate:  Frequent suicidal ideation with limited intensity, and duration, some specificity in terms of plans, no associated intent, good self-control, limited dysphoria/symptomatology, some risk factors present, and identifiable protective factors, including available and accessible social support.  PLAN OF CARE: Patient will be admitted to inpatient psychiatric unit for stabilization and safety. Will provide and encourage milieu participation. Provide medication management and maked adjustments as needed.  Will follow daily.    I certify that inpatient services furnished can reasonably be expected to improve the patient's condition.   Craige CottaFernando A Sanjna Haskew, MD 12/08/2017, 1:31 PM    Addendum- 4,35 PM EKG done QTc elevated at 560. Patient is sedated, no other symptoms at this time Have reviewed with Emergency Room Physician. Will transport to ED for workup ( Mg, K) and management. Have discontinued Trazodone/Trazodone . Continue Zyprexa/Ativan PRN for agitation if needed- Zyprexa is unlikely to be associated with QTc prolongation.   Sallyanne HaversF Ceana Fiala, MD

## 2017-12-08 NOTE — H&P (Addendum)
Psychiatric Admission Assessment Adult  Patient Identification: [name] MRN:  [id number] Date of Evaluation:  12/08/2017 Chief Complaint:  Schizophrenia Principal Diagnosis: Schizophrenia, acute ([place]) Diagnosis:   Patient Active Problem List   Diagnosis Date Noted  . Schizophrenia, acute ([place]) [F23] 04/12/2017  . Psychosis ([place]) [F29] 01/17/2017  . MDD (major depressive disorder), recurrent, severe, with psychosis ([place]) [F33.3] 01/16/2017  . Screen for STD (sexually transmitted disease) [Z11.3] 09/14/2016  . Hyperesthesia [R20.3] 09/14/2016  . Testicular pain [N50.819] 09/14/2016  . Burning with urination [R30.0] 09/14/2016  . Left ear pain [H92.02] 09/14/2016  . Muscle strain [T14.8XXA] 11/09/2015  . Strain of back [S39.012A] 11/09/2015  . Back spasm [M62.830] 11/09/2015   History of Present Illness:  Per assessment note by the TTS department: [name] is an 27 y.o. single male who presents unaccompanied to [facility] ED reporting symptoms of depression. Pt has a history of schizophrenia and appears to have disorganized thought process. Pt's mother reported to EDP that neighbors called the police on the patient tonight after he was found to be outside wearing shorts and running and yelling in the rain. She reports concern for worsening behavior changes.She reports that he is scheduled to see psychiatry in early December, but she is concerned that with his behavior changes that "he won't make it that long."Pt's mother reports the patient has not slept in probably 3 or 4 days. The patient states that it is been about 2. When asked why he was not able to sleep, he states that it was because he was "playing basketball or running track"forming another activity outside. She reports he has barely been eating.   Pt is a poor historian and frequently give irrelevant or tangential responses to questions. Pt says he has felt depressed for the past two years. He acknowledges  symptoms including loss of interest in usual activities, fatigue, irritability and feelings of hopelessness. He acknowledges auditory and visual hallucinations but cannot describe what he is experiencing. He denies current suicidal ideation. He denies current homicidal ideation but acknowledges a history of aggressive behavior. Pt denies alcohol or other substance use. When asked if Pt is experiencing stressors, Pt responds "teeth" but then denies dental pain.   Pt was last inpatient at [facility] in April 2019. Pt says he has a psychiatrist but cannot give a name. Pt is not taking medication as prescribed and told the EDP his only medication is water.   Pt is dressed in hospital scrubs, alert and oriented person, place and time. Pt speaks in a clear tone, at moderate volume and normal pace. Motor behavior appears restless. Eye contact is fair. Pt's mood is anxious and affect is irritable. Thought process is disorganized. Pt appears distracted. Pt stopped the assessment abruptly and said he was no longer answering questions.  Evaluation: [name] reports he was admitted to the hospital because he had a fall.  Reports " the microchip in the back of my head when off" states this caused him to fall.  As he reports electrical current. Patient presents delusion, disorganized, hyperactive and paranoid.  Patient reports he is followed by Dr. Clyde Canterbury however states he is unable to recall his  last  follow-up outpatient  appointment. Patient reports taken "beauty" medications.  Chart reviewed previous inpatient admissions reviewed.  NP to initiate agitation protocol and restart home medications necessary.  Support, encorugment and reassurances was provided.     Associated Signs/Symptoms: Depression Symptoms:  anxiety, (Hypo) Manic Symptoms:  Delusions, Distractibility, Irritable  Mood, Anxiety Symptoms:  Excessive Worry, Psychotic Symptoms:  Delusions, Hallucinations: Auditory Ideas of  Reference, Paranoia, PTSD Symptoms: NA Total Time spent with patient: 1 hour  Past Psychiatric History:  Charted per previous admission: - Previous treatment for psychosis - Last inpatient stay at [facility] in 01/2017 - [facility] - No hx of Suicide attempt  Is the patient at risk to self? Yes.    Has the patient been a risk to self in the past 6 months? Yes.    Has the patient been a risk to self within the distant past? Yes.    Is the patient a risk to others? Yes.    Has the patient been a risk to others in the past 6 months? Yes.    Has the patient been a risk to others within the distant past? Yes.     Prior Inpatient Therapy:   Prior Outpatient Therapy:    Alcohol Screening: 1. How often do you have a drink containing alcohol?: Never 2. How many drinks containing alcohol do you have on a typical day when you are drinking?: 1 or 2 3. How often do you have six or more drinks on one occasion?: Never AUDIT-C Score: 0 9. Have you or someone else been injured as a result of your drinking?: No 10. Has a relative or friend or a doctor or another health worker been concerned about your drinking or suggested you cut down?: No Alcohol Use Disorder Identification Test Final Score (AUDIT): 0 Intervention/Follow-up: AUDIT Score <7 follow-up not indicated Substance Abuse History in the last 12 months:  Yes.   Consequences of Substance Abuse: Medical Consequences:  worsened psychosis Previous Psychotropic Medications: Yes  Psychological Evaluations: Yes  Past Medical History:  Past Medical History:  Diagnosis Date  . Allergic rhinitis    mostly spring  . Depression   . Schizophrenia ([place])   . Strep throat 04/2013   obervation in ED due to high fever, illness    Past Surgical History:  Procedure Laterality Date  . ORCHIOPEXY     bilat, in childhood   Family History:  Family History  Problem Relation Age of Onset  . Other Mother         "tumor of stomach"  . Anemia Mother   . Other Father        unknown  . Diabetes Brother        borderline  . Hypertension Maternal Grandmother   . Cancer Neg Hx   . Stroke Neg Hx   . Heart disease Neg Hx   . Aneurysm Neg Hx    Family Psychiatric  History: history of bipolar disorder in maternal family including mother   Tobacco Screening: Have you used any form of tobacco in the last 30 days? (Cigarettes, Smokeless Tobacco, Cigars, and/or Pipes): Yes Tobacco use, Select all that apply: 4 or less cigarettes per day Are you interested in Tobacco Cessation Medications?: Yes, will notify MD for an order Counseled patient on smoking cessation including recognizing danger situations, developing coping skills and basic information about quitting provided: Yes Social History: Per previous assessment:born and raised in [place]. Lives with his mother and twin brother. Completed GED.  Formally worked at [employer]. No spouse. No Children. Legal history of 19 months incarcerated with trauma of assault which occurred during incarceration.   Social History   Substance and Sexual Activity  Alcohol Use No     Social History   Substance and  Sexual Activity  Drug Use Yes  . Types: Marijuana    Additional Social History:                           Allergies:  No Known Allergies Lab Results:  Results for orders placed or performed during the hospital encounter of 12/07/17 (from the past 48 hour(s))  Comprehensive metabolic panel     Status: Abnormal   Collection Time: 12/07/17  8:21 PM  Result Value Ref Range   Sodium 139 135 - 145 mmol/L   Potassium 3.4 (L) 3.5 - 5.1 mmol/L   Chloride 103 98 - 111 mmol/L   CO2 23 22 - 32 mmol/L   Glucose, Bld 90 70 - 99 mg/dL   BUN 11 6 - 20 mg/dL   Creatinine, Ser 0.92 0.61 - 1.24 mg/dL   Calcium 10.0 8.9 - 10.3 mg/dL   Total Protein 7.8 6.5 - 8.1 g/dL   Albumin 5.0 3.5 - 5.0 g/dL   AST 45 (H) 15 - 41 U/L   ALT 40 0 - 44 U/L    Alkaline Phosphatase 61 38 - 126 U/L   Total Bilirubin 1.0 0.3 - 1.2 mg/dL   GFR calc non Af Amer >60 >60 mL/min   GFR calc Af Amer >60 >60 mL/min   Anion gap 13 5 - 15    Comment: Performed at [facility], [place] [address] [place], [place] [address]  Ethanol     Status: None   Collection Time: 12/07/17  8:21 PM  Result Value Ref Range   Alcohol, Ethyl (B) <10 <10 mg/dL    Comment: (NOTE) Lowest detectable limit for serum alcohol is 10 mg/dL. For medical purposes only. Performed at [facility], [place] [address], [place], [place] [address]   Urine rapid drug screen (hosp performed)     Status: None   Collection Time: 12/07/17  8:21 PM  Result Value Ref Range   Opiates NONE DETECTED NONE DETECTED   Cocaine NONE DETECTED NONE DETECTED   Benzodiazepines NONE DETECTED NONE DETECTED   Amphetamines NONE DETECTED NONE DETECTED   Tetrahydrocannabinol NONE DETECTED NONE DETECTED   Barbiturates NONE DETECTED NONE DETECTED    Comment: (NOTE) DRUG SCREEN FOR MEDICAL PURPOSES ONLY.  IF CONFIRMATION IS NEEDED FOR ANY PURPOSE, NOTIFY LAB WITHIN 5 DAYS. LOWEST DETECTABLE LIMITS FOR URINE DRUG SCREEN Drug Class                     Cutoff (ng/mL) Amphetamine and metabolites    1000 Barbiturate and metabolites    200 Benzodiazepine                 485 Tricyclics and metabolites     300 Opiates and metabolites        300 Cocaine and metabolites        300 THC                            50 Performed at [facility], [place] [address] [place], [place] [address]   CBC with Diff     Status: None   Collection Time: 12/07/17  8:21 PM  Result Value Ref Range   WBC 7.2 4.0 - 10.5 K/uL   RBC 4.70 4.22 - 5.81 MIL/uL   Hemoglobin 13.6 13.0 - 17.0 g/dL   HCT 40.8 39.0 - 52.0 %  MCV 86.8 80.0 - 100.0 fL   MCH 28.9 26.0 - 34.0 pg   MCHC 33.3 30.0 - 36.0 g/dL   RDW 12.0 11.5 - 15.5 %   Platelets 322 150 - 400 K/uL   nRBC 0.0 0.0 - 0.2 %    Neutrophils Relative % 63 %   Neutro Abs 4.5 1.7 - 7.7 K/uL   Lymphocytes Relative 26 %   Lymphs Abs 1.9 0.7 - 4.0 K/uL   Monocytes Relative 10 %   Monocytes Absolute 0.7 0.1 - 1.0 K/uL   Eosinophils Relative 1 %   Eosinophils Absolute 0.1 0.0 - 0.5 K/uL   Basophils Relative 0 %   Basophils Absolute 0.0 0.0 - 0.1 K/uL   Immature Granulocytes 0 %   Abs Immature Granulocytes 0.02 0.00 - 0.07 K/uL    Comment: Performed at [facility], [place] [address], [place], [place] [address]  Salicylate level     Status: None   Collection Time: 12/07/17  8:21 PM  Result Value Ref Range   Salicylate Lvl <7.4 2.8 - 30.0 mg/dL    Comment: Performed at [facility], [place] [address] [place], [place] [address]  Acetaminophen level     Status: Abnormal   Collection Time: 12/07/17  8:21 PM  Result Value Ref Range   Acetaminophen (Tylenol), Serum <10 (L) 10 - 30 ug/mL    Comment: (NOTE) Therapeutic concentrations vary significantly. A range of 10-30 ug/mL  may be an effective concentration for many patients. However, some  are best treated at concentrations outside of this range. Acetaminophen concentrations >150 ug/mL at 4 hours after ingestion  and >50 ug/mL at 12 hours after ingestion are often associated with  toxic reactions. Performed at [facility], [place] [address] [place], [place] [address]     Blood Alcohol level:  Lab Results  Component Value Date   [facility] <10 12/07/2017   ETH <10 43/32/9518    Metabolic Disorder Labs:  Lab Results  Component Value Date   HGBA1C 5.5 04/13/2017   MPG 111.15 04/13/2017   Lab Results  Component Value Date   PROLACTIN 25.2 (H) 04/13/2017   Lab Results  Component Value Date   CHOL 199 04/13/2017   TRIG 71 04/13/2017   HDL 45 04/13/2017   CHOLHDL 4.4 04/13/2017   VLDL 14 04/13/2017   LDLCALC 140 (H) 04/13/2017   LDLCALC 98 09/14/2016    Current Medications: Current Facility-Administered  Medications  Medication Dose Route Frequency Provider Last Rate Last Dose  . acetaminophen (TYLENOL) tablet 650 mg  650 mg Oral Q6H PRN Lindon Romp A, NP      . alum & mag hydroxide-simeth (MAALOX/MYLANTA) 200-200-20 MG/5ML suspension 30 mL  30 mL Oral Q4H PRN Lindon Romp A, NP      . LORazepam (ATIVAN) tablet 2 mg  2 mg Oral Q6H PRN Derrill Center, NP       Or  . LORazepam (ATIVAN) injection 2 mg  2 mg Intramuscular Q6H PRN Derrill Center, NP      . magnesium hydroxide (MILK OF MAGNESIA) suspension 30 mL  30 mL Oral Daily PRN Lindon Romp A, NP      . OLANZapine (ZYPREXA) injection 5 mg  5 mg Intramuscular Q8H PRN Derrill Center, NP       Or  . OLANZapine zydis (ZYPREXA) disintegrating tablet 5 mg  5 mg Oral Q8H PRN Derrill Center, NP   5 mg at 12/08/17 1106  .  paliperidone (INVEGA) 24 hr tablet 6 mg  6 mg Oral QHS Lewis, Tanika N, NP      . traZODone (DESYREL) tablet 100 mg  100 mg Oral QHS,MR X 1 Derrill Center, NP       PTA Medications: Medications Prior to Admission  Medication Sig Dispense Refill Last Dose  . paliperidone (INVEGA SUSTENNA) 156 MG/ML SUSP injection Inject 1 mL (156 mg total) into the muscle every 30 (thirty) days. (Due on 05-19-17): For mood control (Patient taking differently: Inject 156 mg into the muscle every 30 (thirty) days. For mood control) 0.9 mL 0 Past Month at Unknown time  . paliperidone (INVEGA) 9 MG 24 hr tablet Take 1 tablet (9 mg total) by mouth daily. For mood control 15 tablet 0 12/07/2017 at Unknown time  . traZODone (DESYREL) 100 MG tablet Take 1 tablet (100 mg total) by mouth at bedtime as needed for sleep. 30 tablet 0 Past Month at Unknown time    Musculoskeletal: Strength & Muscle Tone: within normal limits Gait & Station: normal Patient leans: N/A  Psychiatric Specialty Exam: Physical Exam  Nursing note and vitals reviewed. Cardiovascular: Normal rate.  Neurological: He is alert.  Psychiatric: He has a normal mood and affect. His  behavior is normal.    Review of Systems  Psychiatric/Behavioral: Positive for hallucinations. Negative for depression and suicidal ideas. The patient is not nervous/anxious and does not have insomnia.   All other systems reviewed and are negative.   There were no vitals taken for this visit.There is no height or weight on file to calculate BMI.  General Appearance: Casual, Disheveled and malodorous  Eye Contact:  Good  Speech:  Clear and Coherent and Normal Rate  Volume:  Normal  Mood:  Anxious and Irritable  Affect:  Blunt and Labile  Thought Process:  Coherent, Goal Directed and Descriptions of Associations: Loose  Orientation:  Full (Time, Place, and Person)  Thought Content:  Delusions, Hallucinations: Auditory, Ideas of Reference:   Paranoia Delusions, Rumination and Tangential  Suicidal Thoughts:  No  Homicidal Thoughts:  No  Memory:  Immediate;   Fair Recent;   Fair Remote;   Fair  Judgement:  Poor  Insight:  Lacking  Psychomotor Activity:  Normal  Concentration:  Concentration: Poor  Recall:  Poor  Fund of Knowledge:  Fair  Language:  Fair  Akathisia:  No  Handed:    AIMS (if indicated):     Assets:  Communication Skills Resilience Social Support  ADL's:  Intact  Cognition:  WNL  Sleep:       Treatment Plan Summary: Daily contact with patient to assess and evaluate symptoms and progress in treatment and Medication management   - See SRA    Observation Level/Precautions:  15 minute checks  Laboratory:  CBC Chemistry Profile HbAIC UDS UA  Psychotherapy:  Encourage participation in groups and therapeutic milieu   Medications:  Start Invega 82m po Qday. Start agitation protocol   Consultations: CSW and psychiatry  Discharge Concerns:  Safety, stabilization, and risk of access to medication and medication stabilization   Estimated LOS: 5-7 days  Other:     Physician Treatment Plan for Primary Diagnosis: Schizophrenia, acute ([place] Long Term Goal(s):  Improvement in symptoms so as ready for discharge  Short Term Goals: Compliance with prescribed medications will improve  Physician Treatment Plan for Secondary Diagnosis: Principal Problem:   Schizophrenia, acute ([place]  Long Term Goal(s): Improvement in symptoms so as ready for discharge  Short  Term Goals: Ability to identify and develop effective coping behaviors will improve  I certify that inpatient services furnished can reasonably be expected to improve the patient's condition.    Derrill Center, NP 12/1/201911:20 AM I have discussed case with NP and have met with patient  Agree with NP note and assessment 27, single, no children, lives with mother, unemployed . Presented to ED with his mother. Patient is a limited historian at this time, reports he came to ED because he fell and has felt tired . Patient states he has a microchip in the back of his head and that if affects his behavior. He denies hallucinations. At this time presents calm, sedated.  As per chart notes, his mother reported neighbors had contacted police because patient was presenting with disorganized behavior, internally preoccupied/hallucinations, found outside wearing shorts and running and yelling in the rain, and not sleeping or eating regularly  x several days. In ED was initially exhibiting agitated, disorganized behaviors.  Patient reports he has been taking medications but is unable to identify  when he last took them. As per chart notes, has been on oral Invega at 9 mgrs QDAY and on  [place] IM monthly. (At this time it is unclear when patient last received this medication.) He denies drug or alcohol abuse and admission BAL/UDS are  negative. He has a history of psychiatric illness and had prior psychiatric admission to [facility] in April 2019. At the time was admitted due to  worsening psychosis, disorganized behaviors, agitation, with similar  delusion of having an implanted microchip. He denies medical  illnesses .  Dx- Schizophrenia by history  Plan- Inpatient admission Continue Invega at 6 mgrs QHS for psychosis. Treatment team will work on determining most recent [place] dose to establish next projected administration date.  Agitation protocol- Zyprexa/ Ativan Change Trazodone to 50 mgrs QHS PRN  Check Lipid Panel and HgbA1C, and recheck EKG to monitor QTc

## 2017-12-08 NOTE — ED Notes (Signed)
[facility] Financial [facility]atrice [facility]

## 2017-12-08 NOTE — ED Notes (Signed)
Up in room, alert, pleasant.  Pt denies si/hi/avh at this time.  Pt has appeared to be responding to internal stimulus at times.  Pt states that it is "oct, 2021..." and is unable to report who the president is.  Pt reports that he is here for anxiety.  Pt also reports that he fell yesterday, his ankle twisted causing him to fall.  NAD, ambulatory w/o difficulty, procedures explained.

## 2017-12-08 NOTE — ED Notes (Signed)
Bed: [id number] Expected date:  Expected time:  Means of arrival:  Comments: BH tx proloned QT

## 2017-12-08 NOTE — Progress Notes (Signed)
Per Dr. Jama Flavorsobos, do not administer Trazodone, Haldol, Invega or Vistaril to pt due to prolonged QTC this evening 12/08/17.

## 2017-12-08 NOTE — [facility] Group Notes (Signed)
[facility]  Date/Time:  12/08/2017  11:00AM-12:00PM  Type of Therapy and Topic:  Group Therapy:  Music and Mood  Participation Level:  Active   Description of Group: In this process group, members listened to a variety of genres of music and identified that different types of music evoke different responses.  Patients were encouraged to identify music that was soothing for them and music that was energizing for them.  Patients discussed how this knowledge can help with wellness and recovery in various ways including managing depression and anxiety as well as encouraging healthy sleep habits.    Therapeutic Goals: 1. Patients will explore the impact of different varieties of music on mood 2. Patients will verbalize the thoughts they have when listening to different types of music 3. Patients will identify music that is soothing to them as well as music that is energizing to them 4. Patients will discuss how to use this knowledge to assist in maintaining wellness and recovery 5. Patients will explore the use of music as a coping skill  Summary of Patient Progress:  At the beginning of group, patient expressed that he felt fine and asked if he could select a song.  He was allowed to do this, but at the end of his song, he said it made him "depressed."  We then were able to discuss how it would probably be best to not listen to such a song, especially when he is feeling down already.  He did sing loudly with words that were not actually part of the songs throughout group.  He kept moving around the room, and finally laid his head back against the wall and fell asleep.  Therapeutic Modalities: Solution Focused Brief Therapy Activity   Ambrose MantleMareida Grossman-Orr, LCSW

## 2017-12-08 NOTE — Progress Notes (Signed)
Per Dr. Jama Flavorsobos, send pt to the ED for an eval due to elevated QTC. Pt QTC elevated, ranging from 555-560. Pt sent to the ED via EMS [facility]. Pt noted to be sedated at the time of transport. Pt responsive to voice command. Writer [facility]

## 2017-12-08 NOTE — B[facility] Counselor (Signed)
Clinical Social Work Note  CSW unable to do Psychosocial Assessment with pt because he is sedated at this time.  CSW to follow up tomorrow.  Ambrose MantleMareida Grossman-Orr, LCSW 12/08/2017, 3:13 PM

## 2017-12-08 NOTE — ED Triage Notes (Signed)
[facility] of reported EKG changes, QT elongation. Pt has no complaints.

## 2017-12-08 NOTE — ED Notes (Signed)
Pt ambulatory w/o difficulty to [facility] with [facility], belongings given to driver. 

## 2017-12-08 NOTE — ED Notes (Signed)
CSW into see 

## 2017-12-08 NOTE — ED Notes (Signed)
Up to the bathroom 

## 2017-12-08 NOTE — ED Notes (Signed)
Pt presents with Anxiety and Depression worsening in severity over the past few days.  Denies SI, HI or AVH.  A&O x 3.  Pt sleeping and difficult to arouse for assessment.  Monitoring for safety, Q 15 min checks in effect.

## 2017-12-08 NOTE — ED Provider Notes (Signed)
[facility]-EMERGENCY DEPT Provider Note   CSN: [id number] Arrival date & time: 12/08/17  1656     History   Chief Complaint Chief Complaint  Patient presents with  . EKG Changes    HPI [name] is a 27 y.o. male.  27 yo M with a chief complaint of prolonged QT.  The patient is currently in inpatient psychiatric care he had an EKG done earlier today that showed a QTC greater than 600.  I discussed the case with the psychiatrist and he was concerned about this change from yesterday and we decided that he should come to the ED for evaluation.  Patient has no complaints on my exam.  He denies chest pain or shortness of breath.  Denies feeling like he may pass out.  The history is provided by the patient.  Illness  This is a new problem. The current episode started yesterday. The problem occurs constantly. The problem has not changed since onset.Pertinent negatives include no chest pain, no abdominal pain, no headaches and no shortness of breath. Nothing aggravates the symptoms. Nothing relieves the symptoms. He has tried nothing for the symptoms. The treatment provided no relief.    Past Medical History:  Diagnosis Date  . Allergic rhinitis    mostly spring  . Depression   . Schizophrenia (HCC)   . Strep throat 04/2013   obervation in ED due to high fever, illness    Patient Active Problem List   Diagnosis Date Noted  . Schizophrenia, acute (HCC) 04/12/2017  . Psychosis (HCC) 01/17/2017  . MDD (major depressive disorder), recurrent, severe, with psychosis (HCC) 01/16/2017  . Screen for STD (sexually transmitted disease) 09/14/2016  . Hyperesthesia 09/14/2016  . Testicular pain 09/14/2016  . Burning with urination 09/14/2016  . Left ear pain 09/14/2016  . Muscle strain 11/09/2015  . Strain of back 11/09/2015  . Back spasm 11/09/2015    Past Surgical History:  Procedure Laterality Date  . ORCHIOPEXY     bilat, in childhood        Home  Medications    Prior to Admission medications   Medication Sig Start Date End Date Taking? Authorizing Provider  paliperidone (INVEGA SUSTENNA) 156 MG/ML SUSP injection Inject 1 mL (156 mg total) into the muscle every 30 (thirty) days. (Due on 05-19-17): For mood control Patient taking differently: Inject 156 mg into the muscle every 30 (thirty) days. For mood control 05/19/17   Armandina Stammer I, NP  paliperidone (INVEGA) 9 MG 24 hr tablet Take 1 tablet (9 mg total) by mouth daily. For mood control 04/20/17   Armandina Stammer I, NP  traZODone (DESYREL) 100 MG tablet Take 1 tablet (100 mg total) by mouth at bedtime as needed for sleep. 04/19/17   Sanjuana Kava, NP    Family History Family History  Problem Relation Age of Onset  . Other Mother        "tumor of stomach"  . Anemia Mother   . Other Father        unknown  . Diabetes Brother        borderline  . Hypertension Maternal Grandmother   . Cancer Neg Hx   . Stroke Neg Hx   . Heart disease Neg Hx   . Aneurysm Neg Hx     Social History Social History   Tobacco Use  . Smoking status: Former Games developer  . Smokeless tobacco: Never Used  Substance Use Topics  . Alcohol use: No  .  Drug use: Yes    Types: Marijuana     Allergies   Patient has no known allergies.   Review of Systems Review of Systems  Constitutional: Negative for chills and fever.  HENT: Negative for congestion and facial swelling.   Eyes: Negative for discharge and visual disturbance.  Respiratory: Negative for shortness of breath.   Cardiovascular: Negative for chest pain and palpitations.  Gastrointestinal: Negative for abdominal pain, diarrhea and vomiting.  Musculoskeletal: Negative for arthralgias and myalgias.  Skin: Negative for color change and rash.  Neurological: Negative for tremors, syncope and headaches.  Psychiatric/Behavioral: Negative for confusion and dysphoric mood.     Physical Exam Updated Vital Signs BP (!) 133/91   Pulse (!) 107    Temp 98 F (36.7 C) (Oral)   Resp 18   Ht 6\' 1"  (1.854 m)   Wt 95.7 kg   SpO2 100%   BMI 27.84 kg/m   Physical Exam  Constitutional: He is oriented to person, place, and time. He appears well-developed and well-nourished.  HENT:  Head: Normocephalic and atraumatic.  Eyes: Pupils are equal, round, and reactive to light. EOM are normal.  Neck: Normal range of motion. Neck supple. No JVD present.  Cardiovascular: Normal rate and regular rhythm. Exam reveals no gallop and no friction rub.  No murmur heard. Pulmonary/Chest: No respiratory distress. He has no wheezes.  Abdominal: He exhibits no distension. There is no rebound and no guarding.  Musculoskeletal: Normal range of motion.  Neurological: He is alert and oriented to person, place, and time.  Skin: No rash noted. No pallor.  Psychiatric: He has a normal mood and affect. His behavior is normal.  Nursing note and vitals reviewed.    ED Treatments / Results  Labs (all labs ordered are listed, but only abnormal results are displayed) Labs Reviewed  BASIC METABOLIC PANEL - Abnormal; Notable for the following components:      Result Value   Potassium 3.2 (*)    All other components within normal limits  CBC WITH DIFFERENTIAL/PLATELET  MAGNESIUM    EKG EKG Interpretation  Date/Time:  Sunday December 08 2017 17:10:23 EST Ventricular Rate:  95 PR Interval:    QRS Duration: 94 QT Interval:  372 QTC Calculation: 468 R Axis:   78 Text Interpretation:  Sinus rhythm No significant change since last tracing Confirmed by Melene PlanFloyd, Jnai Snellgrove [phone]([id number]) on 12/08/2017 5:32:14 PM   Radiology No results found.  Procedures Procedures (including critical care time)  Medications Ordered in ED Medications  acetaminophen (TYLENOL) tablet 650 mg (has no administration in time range)  alum & mag hydroxide-simeth (MAALOX/MYLANTA) 200-200-20 MG/5ML suspension 30 mL (has no administration in time range)  magnesium hydroxide (MILK OF MAGNESIA)  suspension 30 mL (has no administration in time range)  LORazepam (ATIVAN) tablet 2 mg (has no administration in time range)    Or  LORazepam (ATIVAN) injection 2 mg (has no administration in time range)  OLANZapine (ZYPREXA) injection 5 mg (has no administration in time range)    Or  OLANZapine zydis (ZYPREXA) disintegrating tablet 5 mg (has no administration in time range)  potassium chloride SA (K-DUR,KLOR-CON) CR tablet 40 mEq (has no administration in time range)     Initial Impression / Assessment and Plan / ED Course  I have reviewed the triage vital signs and the nursing notes.  Pertinent labs & imaging results that were available during my care of the patient were reviewed by me and considered in my medical decision making (  see chart for details).     27 yo M with a chief complaint of prolonged QT on his EKG.  His initial EKG here I manually calculated the QTC was 475.  He had a repeat EKG shortly thereafter that was machine read at 468.  I feel that the QTC of greater than 600 was likely a computer error on the EKG based on the morphology of the T wave.  Will obtain electrolytes.  Potassium is mildly low at 3.2.  We will give a dose orally here.  Will discharge back to the mental health facility.  The patients results and plan were reviewed and discussed.   Any x-rays performed were independently reviewed by myself.   Differential diagnosis were considered with the presenting HPI.  Medications  acetaminophen (TYLENOL) tablet 650 mg (has no administration in time range)  alum & mag hydroxide-simeth (MAALOX/MYLANTA) 200-200-20 MG/5ML suspension 30 mL (has no administration in time range)  magnesium hydroxide (MILK OF MAGNESIA) suspension 30 mL (has no administration in time range)  LORazepam (ATIVAN) tablet 2 mg (has no administration in time range)    Or  LORazepam (ATIVAN) injection 2 mg (has no administration in time range)  OLANZapine (ZYPREXA) injection 5 mg (has no  administration in time range)    Or  OLANZapine zydis (ZYPREXA) disintegrating tablet 5 mg (has no administration in time range)  potassium chloride SA (K-DUR,KLOR-CON) CR tablet 40 mEq (has no administration in time range)    Vitals:   12/08/17 1055 12/08/17 1056  BP: (!) 144/87 (!) 133/91  Pulse: (!) 107   Resp: 18   Temp: 98 F (36.7 C)   TempSrc: Oral   SpO2: 100%   Weight: 95.7 kg   Height: 6\' 1"  (1.854 m)     Final diagnoses:  Hypokalemia  Schizophrenia, acute (HCC)      Final Clinical Impressions(s) / ED Diagnoses   Final diagnoses:  Hypokalemia  Schizophrenia, acute [facility])    ED Discharge Orders    None       Melene Plan, DO 12/08/17 1850

## 2017-12-08 NOTE — Tx Team (Signed)
Initial Treatment Plan 12/08/2017 11:32 AM Gene Aloeashawn A Adcox WUJ:[id number]    PATIENT STRESSORS: Medication change or noncompliance   PATIENT STRENGTHS: Ability for insight Supportive family/friends   PATIENT IDENTIFIED PROBLEMS: "none". Pt is a poor historian. UTA                      DISCHARGE CRITERIA:  Ability to meet basic life and health needs Adequate post-discharge living arrangements Improved stabilization in mood, thinking, and/or behavior  PRELIMINARY DISCHARGE PLAN: Attend aftercare/continuing care group Attend PHP/IOP  PATIENT/FAMILY INVOLVEMENT: This treatment plan has been presented to and reviewed with the patient, [name], and/or family member.  The patient and family have been given the opportunity to ask questions and make suggestions.  Layla BarterWhite, Clemons Salvucci L, RN 12/08/2017, 11:32 AM

## 2017-12-08 NOTE — ED Notes (Signed)
Pt watching tv, declined tylenol for pain

## 2017-12-08 NOTE — Progress Notes (Signed)
[facility], patient signed in voluntarily, copies of voluntary admission form and ROI on chart.  BED: 502-1  RN Call for report: [phone]  Enid Cutterharlotte Echo [place]sbrook, LCSW-A Clinical Social Worker [phone]
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
## 2017-12-08 NOTE — ED Notes (Addendum)
Up in hall walking, pleasant, singing at times, [facility], [facility] is here to transport.  Patrice [facility] updated.

## 2017-12-09 MED ORDER — PRENATAL MULTIVITAMIN CH
1.0000 | ORAL_TABLET | Freq: Every day | ORAL | Status: DC
  Administered 2017-12-09 – 2017-12-12 (×4): 1 via ORAL
  Filled 2017-12-09 (×5): qty 1

## 2017-12-09 MED ORDER — RISPERIDONE 2 MG PO TABS
2.0000 mg | ORAL_TABLET | Freq: Two times a day (BID) | ORAL | Status: DC
  Administered 2017-12-09 – 2017-12-12 (×7): 2 mg via ORAL
  Filled 2017-12-09 (×11): qty 1

## 2017-12-09 MED ORDER — BENZTROPINE MESYLATE 0.5 MG PO TABS
0.5000 mg | ORAL_TABLET | Freq: Two times a day (BID) | ORAL | Status: DC
  Administered 2017-12-09 – 2017-12-12 (×7): 0.5 mg via ORAL
  Filled 2017-12-09 (×11): qty 1

## 2017-12-09 MED ORDER — PROPRANOLOL HCL 10 MG PO TABS
10.0000 mg | ORAL_TABLET | Freq: Two times a day (BID) | ORAL | Status: DC
  Administered 2017-12-09 – 2017-12-12 (×7): 10 mg via ORAL
  Filled 2017-12-09 (×11): qty 1

## 2017-12-09 MED ORDER — DIPHENHYDRAMINE HCL 50 MG PO CAPS
50.0000 mg | ORAL_CAPSULE | Freq: Once | ORAL | Status: AC
  Administered 2017-12-09: 50 mg via ORAL
  Filled 2017-12-09: qty 1
  Filled 2017-12-09: qty 2

## 2017-12-09 NOTE — Progress Notes (Signed)
Recreation Therapy Notes  INPATIENT RECREATION THERAPY ASSESSMENT  Patient Details Name: [name] MRN: [id number] DOB: 1991/01/06 Today's Date: 12/09/2017       Information Obtained From: Patient  Able to Participate in Assessment/Interview: Yes  Patient Presentation: Alert, Confused  Reason for Admission (Per Patient): Other (Comments)(Pt stated playing around too much.)  Patient Stressors: Other (Comment)(Pt stated too much fun in the outside world.)  Coping Skills:   Journal, Sports, TV, Arguments, Aggression, Music, Exercise, Meditate, Deep Breathing, Art, Talk, Prayer, Avoidance, Read, Hot Bath/Shower  Leisure Interests (2+):  Individual - Writing, Individual - Reading  Frequency of Recreation/Participation: Other (Comment)(Daily)  Awareness of Community Resources:  Yes  Community Resources:  Library  Current Use: Yes  If no, Barriers?:    Expressed Interest in [employer] CorporationCommunity Resource Information: No  [place] EnergyCounty of Residence[place]  Patient Main Form of Transportation: Set designerCar  Patient Strengths:  Data processing manageratience; Listening  Patient Identified Areas of Improvement:  Letting go; Being in present moment (be normal)  Patient Goal for Hospitalization:  "Realize how I got there and improve on that"  Current SI (including self-harm):  No  Current HI:  No  Current AVH: Yes(Pt rated it a 10, stated he was hearing sounds from the images he was seeing.)  Staff Intervention Plan: Group Attendance, Collaborate with Interdisciplinary Treatment Team  Consent to Intern Participation: N/A    [name], LRT/CTRS  Lillia AbedLindsay, Joretta Eads A 12/09/2017, 1:45 PM
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
## 2017-12-09 NOTE — Progress Notes (Signed)
[facility] MD Progress Note  12/09/2017 7:42 AM [name]  MRN:  [id number] Subjective:    This is hospital day #2 for [name] a 27 year old patient with known schizophrenia, who tells me he is in the hospital because "there is a microchip in the back of my head" he states he had a scan that showed nothing there but he still feels like something is there.  When asked if he is seeing things he flips the cover of his pillow and states there faces in it  He was brought to the hospital there were 2 concerns he had possibly been noncompliant with his in [place] but he is thought of had a long-acting injectable at some point, further he was behaving bizarrely neighbors noticed that he was psychotic, walking around the rain outside so forth.  There is also a computer error that over read an EKG QTC over 600 but that was rechecked and it was 475  Again at present the patient states he sees faces in the bed sheets and he believes there is a microchip in his head and he states he is responded best in the past to Risperdal and would like to try that again  principal Problem: Schizophrenia, acute (HCC) Diagnosis: Principal Problem:   Schizophrenia, acute (HCC)  Total Time spent with patient: 20 minutes Past Medical History:  Past Medical History:  Diagnosis Date  . Allergic rhinitis    mostly spring  . Depression   . Schizophrenia (HCC)   . Strep throat 04/2013   obervation in ED due to high fever, illness    Past Surgical History:  Procedure Laterality Date  . ORCHIOPEXY     bilat, in childhood   Family History:  Family History  Problem Relation Age of Onset  . Other Mother        "tumor of stomach"  . Anemia Mother   . Other Father        unknown  . Diabetes Brother        borderline  . Hypertension Maternal Grandmother   . Cancer Neg Hx   . Stroke Neg Hx   . Heart disease Neg Hx   . Aneurysm Neg Hx    Social History:  Social History   Substance and Sexual Activity   Alcohol Use No     Social History   Substance and Sexual Activity  Drug Use Yes  . Types: Marijuana    Social History   Socioeconomic History  . Marital status: Single    Spouse name: Not on file  . Number of children: Not on file  . Years of education: Not on file  . Highest education level: Not on file  Occupational History  . Not on file  Social Needs  . Financial resource strain: Not on file  . Food insecurity:    Worry: Not on file    Inability: Not on file  . Transportation needs:    Medical: Not on file    Non-medical: Not on file  Tobacco Use  . Smoking status: Former Games developer  . Smokeless tobacco: Never Used  Substance and Sexual Activity  . Alcohol use: No  . Drug use: Yes    Types: Marijuana  . Sexual activity: Yes    Birth control/protection: None  Lifestyle  . Physical activity:    Days per week: Not on file    Minutes per session: Not on file  . Stress: Not on file  Relationships  . Social connections:  Talks on phone: Not on file    Gets together: Not on file    Attends religious service: Not on file    Active member of club or organization: Not on file    Attends meetings of clubs or organizations: Not on file    Relationship status: Not on file  Other Topics Concern  . 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   Lives with parents, from [place], high school at [place], [place] for a while, dropped out.  Wants to go into HVAC work.  Exercise - plays basketball regularly.  Currently trying to find work to [employer] to return to school.   Prior incarceration x 52mo for theft.     Sleep: Fair  Appetite:  Fair  Current Medications: Current Facility-Administered Medications  Medication Dose Route Frequency Provider Last Rate Last Dose  . acetaminophen (TYLENOL) tablet 650 mg  650 mg Oral Q6H PRN Nira Conn A, NP      . alum & mag hydroxide-simeth (MAALOX/MYLANTA)  200-200-20 MG/5ML suspension 30 mL  30 mL Oral Q4H PRN Nira Conn A, NP      . benztropine (COGENTIN) tablet 0.5 mg  0.5 mg Oral BID Malvin Johns, MD      . LORazepam (ATIVAN) tablet 2 mg  2 mg Oral Q6H PRN Cobos, Rockey Situ, MD       Or  . LORazepam (ATIVAN) injection 2 mg  2 mg Intramuscular Q6H PRN Cobos, Rockey Situ, MD      . magnesium hydroxide (MILK OF MAGNESIA) suspension 30 mL  30 mL Oral Daily PRN Nira Conn A, NP      . nicotine (NICODERM CQ - dosed in mg/24 hours) patch 21 mg  21 mg Transdermal Daily Oneta Rack, NP      . OLANZapine (ZYPREXA) injection 5 mg  5 mg Intramuscular Q8H PRN Cobos, Rockey Situ, MD       Or  . OLANZapine zydis (ZYPREXA) disintegrating tablet 5 mg  5 mg Oral Q8H PRN Cobos, Rockey Situ, MD      . prenatal multivitamin tablet 1 tablet  1 tablet Oral Q1200 Malvin Johns, MD      . risperiDONE (RISPERDAL) tablet 2 mg  2 mg Oral BID Malvin Johns, MD        Lab Results:  Results for orders placed or performed during the hospital encounter of 12/08/17 (from the past 48 hour(s))  CBC with Differential     Status: None   Collection Time: 12/08/17  5:02 PM  Result Value Ref Range   WBC 5.3 4.0 - 10.5 K/uL   RBC 4.79 4.22 - 5.81 MIL/uL   Hemoglobin 13.8 13.0 - 17.0 g/dL   HCT 16.1 09.6 - 04.5 %   MCV 89.6 80.0 - 100.0 fL   MCH 28.8 26.0 - 34.0 pg   MCHC 32.2 30.0 - 36.0 g/dL   RDW 40.9 81.1 - 91.4 %   Platelets 284 150 - 400 K/uL   nRBC 0.0 0.0 - 0.2 %   Neutrophils Relative % 38 %   Neutro Abs 2.0 1.7 - 7.7 K/uL   Lymphocytes Relative 46 %   Lymphs Abs 2.5 0.7 - 4.0 K/uL   Monocytes Relative 12 %   Monocytes Absolute 0.6 0.1 - 1.0 K/uL   Eosinophils Relative 4 %   Eosinophils Absolute 0.2 0.0 - 0.5 K/uL   Basophils Relative 0 %  Basophils Absolute 0.0 0.0 - 0.1 K/uL   Immature Granulocytes 0 %   Abs Immature Granulocytes 0.01 0.00 - 0.07 K/uL    Comment: Performed at [facility], [address] [place], [place] [address]   Basic metabolic panel     Status: Abnormal   Collection Time: 12/08/17  5:02 PM  Result Value Ref Range   Sodium 138 135 - 145 mmol/L   Potassium 3.2 (L) 3.5 - 5.1 mmol/L   Chloride 103 98 - 111 mmol/L   CO2 25 22 - 32 mmol/L   Glucose, Bld 87 70 - 99 mg/dL   BUN 12 6 - 20 mg/dL   Creatinine, Ser 6.041.10 0.61 - 1.24 mg/dL   Calcium 9.0 8.9 - 54.010.3 mg/dL   GFR calc non Af Amer >60 >60 mL/min   GFR calc Af Amer >60 >60 mL/min   Anion gap 10 5 - 15    Comment: Performed at [facility], [address] [place], [place] [address]  Magnesium     Status: None   Collection Time: 12/08/17  5:02 PM  Result Value Ref Range   Magnesium 2.3 1.7 - 2.4 mg/dL    Comment: Performed at [facility], [address] [place], [place] [address]    Blood Alcohol level:  Lab Results  Component Value Date   [facility] 12/07/2017   ETH <10 04/11/2017    Metabolic Disorder Labs: Lab Results  Component Value Date   HGBA1C 5.5 04/13/2017   MPG 111.15 04/13/2017   Lab Results  Component Value Date   PROLACTIN 25.2 (H) 04/13/2017   Lab Results  Component Value Date   CHOL 199 04/13/2017   TRIG 71 04/13/2017   HDL 45 04/13/2017   CHOLHDL 4.4 04/13/2017   VLDL 14 04/13/2017   LDLCALC 140 (H) 04/13/2017   LDLCALC 98 09/14/2016    Physical Findings: AIMS: Facial and Oral Movements Muscles of Facial Expression: None, normal Lips and Perioral Area: None, normal Jaw: None, normal Tongue: None, normal,Extremity Movements Upper (arms, wrists, hands, fingers): None, normal Lower (legs, knees, ankles, toes): None, normal, Trunk Movements Neck, shoulders, hips: None, normal, Overall Severity Severity of abnormal movements (highest score from questions above): None, normal Incapacitation due to abnormal movements: None, normal Patient's awareness of abnormal movements (rate only patient's report): No Awareness, Dental Status Current problems with teeth and/or  dentures?: No Does patient usually wear dentures?: No  CIWA:    COWS:     Musculoskeletal: Strength & Muscle Tone: within normal limits Gait & Station: normal Patient leans: N/A  Psychiatric Specialty Exam: Physical Exam  ROS  Blood pressure 114/80, pulse (!) 138, temperature 97.6 F (36.4 C), temperature source Oral, resp. rate 18, height 6\' 1"  (1.854 m), weight 95.7 kg, SpO2 100 %.Body mass index is 27.84 kg/m.  General Appearance: Casual  Eye Contact:  Good  Speech:  Clear and Coherent  Volume:  Normal  Mood:  Euthymic  Affect:  Congruent  Thought Process:  Descriptions of Associations: Tangential  Orientation:  Full (Time, Place, and Person)  Thought Content:  Delusions and Hallucinations: Visual  Suicidal Thoughts:  No  Homicidal Thoughts:  No  Memory:  Immediate;   Fair  Judgement:  Fair  Insight:  Fair  Psychomotor Activity:  Normal  Concentration:  Concentration: Fair  Recall:  Fis[employer]Fair  Fund of Knowledge:  Fair  Language:  Good  Akathisia:  Negative  Handed:  Right  AIMS (if indicated):  Assets:  Communication Skills Desire for Improvement  ADL's:  Intact  Cognition:  WNL  Sleep:  Number of Hours: 6.75   Switch to Risperdal as he is requesting continue reality based therapy  Treatment Plan Summary: Daily contact with patient to assess and evaluate symptoms and progress in treatment and Medication management  Claira Jeter, MD 12/09/2017, 7:42 AM

## 2017-12-09 NOTE — Tx Team (Signed)
Interdisciplinary Treatment and Diagnostic Plan Update  12/09/2017 Time of Session: 5885 [name] MRN: [id number]  Principal Diagnosis: Schizophrenia, acute [facility])  Secondary Diagnoses: Principal Problem:   Schizophrenia, acute ([place])   Current Medications:  Current Facility-Administered Medications  Medication Dose Route Frequency Provider Last Rate Last Dose  . acetaminophen (TYLENOL) tablet 650 mg  650 mg Oral Q6H PRN Lindon Romp A, NP      . alum & mag hydroxide-simeth (MAALOX/MYLANTA) 200-200-20 MG/5ML suspension 30 mL  30 mL Oral Q4H PRN Lindon Romp A, NP      . benztropine (COGENTIN) tablet 0.5 mg  0.5 mg Oral BID Johnn Hai, MD   0.5 mg at 12/09/17 0917  . LORazepam (ATIVAN) tablet 2 mg  2 mg Oral Q6H PRN Cobos, Myer Peer, MD       Or  . LORazepam (ATIVAN) injection 2 mg  2 mg Intramuscular Q6H PRN Cobos, Myer Peer, MD      . magnesium hydroxide (MILK OF MAGNESIA) suspension 30 mL  30 mL Oral Daily PRN Lindon Romp A, NP      . nicotine (NICODERM CQ - dosed in mg/24 hours) patch 21 mg  21 mg Transdermal Daily Derrill Center, NP      . OLANZapine (ZYPREXA) injection 5 mg  5 mg Intramuscular Q8H PRN Cobos, Myer Peer, MD       Or  . OLANZapine zydis (ZYPREXA) disintegrating tablet 5 mg  5 mg Oral Q8H PRN Cobos, Myer Peer, MD      . prenatal multivitamin tablet 1 tablet  1 tablet Oral Q1200 Johnn Hai, MD      . propranolol (INDERAL) tablet 10 mg  10 mg Oral BID Johnn Hai, MD   10 mg at 12/09/17 0917  . risperiDONE (RISPERDAL) tablet 2 mg  2 mg Oral BID Johnn Hai, MD   2 mg at 12/09/17 8676   PTA Medications: Medications Prior to Admission  Medication Sig Dispense Refill Last Dose  . paliperidone (INVEGA SUSTENNA) 156 MG/ML SUSP injection Inject 1 mL (156 mg total) into the muscle every 30 (thirty) days. (Due on 05-19-17): For mood control (Patient taking differently: Inject 156 mg into the muscle every 30 (thirty) days. For mood control) 0.9 mL 0 Past  Month at Unknown time  . paliperidone (INVEGA) 9 MG 24 hr tablet Take 1 tablet (9 mg total) by mouth daily. For mood control 15 tablet 0 12/07/2017 at Unknown time  . traZODone (DESYREL) 100 MG tablet Take 1 tablet (100 mg total) by mouth at bedtime as needed for sleep. 30 tablet 0 Past Month at Unknown time    Patient Stressors: Medication change or noncompliance  Patient Strengths: Ability for insight Supportive family/friends  Treatment Modalities: Medication Management, Group therapy, Case management,  1 to 1 session with clinician, Psychoeducation, Recreational therapy.   Physician Treatment Plan for Primary Diagnosis: Schizophrenia, acute ([place]) Long Term Goal(s): Improvement in symptoms so as ready for discharge Improvement in symptoms so as ready for discharge   Short Term Goals: Compliance with prescribed medications will improve Ability to identify and develop effective coping behaviors will improve  Medication Management: Evaluate patient's response, side effects, and tolerance of medication regimen.  Therapeutic Interventions: 1 to 1 sessions, Unit Group sessions and Medication administration.  Evaluation of Outcomes: Not Met  Physician Treatment Plan for Secondary Diagnosis: Principal Problem:   Schizophrenia, acute ([place])  Long Term Goal(s): Improvement in symptoms so as ready for discharge Improvement in symptoms so as  ready for discharge   Short Term Goals: Compliance with prescribed medications will improve Ability to identify and develop effective coping behaviors will improve     Medication Management: Evaluate patient's response, side effects, and tolerance of medication regimen.  Therapeutic Interventions: 1 to 1 sessions, Unit Group sessions and Medication administration.  Evaluation of Outcomes: Not Met   RN Treatment Plan for Primary Diagnosis: Schizophrenia, acute ([place]) Long Term Goal(s): Knowledge of disease and therapeutic regimen to maintain  health will improve  Short Term Goals: Ability to identify and develop effective coping behaviors will improve and Compliance with prescribed medications will improve  Medication Management: RN will administer medications as ordered by provider, will assess and evaluate patient's response and provide education to patient for prescribed medication. RN will report any adverse and/or side effects to prescribing provider.  Therapeutic Interventions: 1 on 1 counseling sessions, Psychoeducation, Medication administration, Evaluate responses to treatment, Monitor vital signs and CBGs as ordered, Perform/monitor CIWA, COWS, AIMS and Fall Risk screenings as ordered, Perform wound care treatments as ordered.  Evaluation of Outcomes: Not Met   LCSW Treatment Plan for Primary Diagnosis: Schizophrenia, acute ([place]) Long Term Goal(s): Safe transition to appropriate next level of care at discharge, Engage patient in therapeutic group addressing interpersonal concerns.  Short Term Goals: Engage patient in aftercare planning with referrals and resources, Increase social support and Increase skills for wellness and recovery  Therapeutic Interventions: Assess for all discharge needs, 1 to 1 time with Social worker, Explore available resources and support systems, Assess for adequacy in community support network, Educate family and significant other(s) on suicide prevention, Complete Psychosocial Assessment, Interpersonal group therapy.  Evaluation of Outcomes: Not Met   Progress in Treatment: Attending groups: Yes. Participating in groups: Yes. Taking medication as prescribed: Yes. Toleration medication: Yes. Family/Significant other contact made: No, will contact:  mother Patient understands diagnosis: No. Discussing patient identified problems/goals with staff: Yes. Medical problems stabilized or resolved: Yes. Denies suicidal/homicidal ideation: Yes. Issues/concerns per patient self-inventory:  No. Other: none  New problem(s) identified: No, Describe:  none  New Short Term/Long Term Goal(s):  Patient Goals:  "to go home quickly"  Discharge Plan or Barriers:   Reason for Continuation of Hospitalization: Hallucinations Medication stabilization  Estimated Length of Stay: 3-5 days.  Attendees: Patient: [name] 12/09/2017   Physician: Dr. Jake Samples, MD 12/09/2017   Nursing: Neldon Newport, RN 12/09/2017   RN Care Manager: 12/09/2017   Social Worker: Lurline Idol, LCSW 12/09/2017   Recreational Therapist:  12/09/2017   Other:  12/09/2017   Other:  12/09/2017   Other: 12/09/2017     Scribe for Treatment Team: Joanne Chars, LCSW 12/09/2017 10:18 AM

## 2017-12-09 NOTE — [facility] Group Notes (Signed)
[facility] LCSW Group Therapy Note  Date/Time: 12/09/17, 1315  Type of Therapy and Topic:  Group Therapy:  Overcoming Obstacles  Participation Level:  Did not attend  Description of Group:    In this group patients will be encouraged to explore what they see as obstacles to their own wellness and recovery. They will be guided to discuss their thoughts, feelings, and behaviors related to these obstacles. The group will process together ways to cope with barriers, with attention given to specific choices patients can make. Each patient will be challenged to identify changes they are motivated to make in order to overcome their obstacles. This group will be process-oriented, with patients participating in exploration of their own experiences as well as giving and receiving support and challenge from other group members.  Therapeutic Goals: 1. Patient will identify personal and current obstacles as they relate to admission. 2. Patient will identify barriers that currently interfere with their wellness or overcoming obstacles.  3. Patient will identify feelings, thought process and behaviors related to these barriers. 4. Patient will identify two changes they are willing to make to overcome these obstacles:    Summary of Patient Progress      Therapeutic Modalities:   Cognitive Behavioral Therapy Solution Focused Therapy Motivational Interviewing Relapse Prevention Therapy  Daleen SquibbGreg Montrail Mehrer, LCSW

## 2017-12-09 NOTE — [facility] Suicide Risk Assessment (Signed)
[facility] INPATIENT:  Family/Significant Other Suicide Prevention Education  Suicide Prevention Education:  Contact Attempts: [name], mother, [phone], has been identified by the patient as the family member/significant other with whom the patient will be residing, and identified as the person(s) who will aid the patient in the event of a mental health crisis.  With written consent from the patient, two attempts were made to provide suicide prevention education, prior to and/or following the patient's discharge.  We were unsuccessful in providing suicide prevention education.  A suicide education pamphlet was given to the patient to share with family/significant other.  Date and time of first attempt:12/09/17, 1506 Date and time of second attempt:  [name], Gene Rieke Jon, LCSW 12/09/2017, 3:06 PM

## 2017-12-09 NOTE — Progress Notes (Signed)
Patient has been isolative to his room since returning from the ED. He received snacks and his laundry that he requested. Safety maintained on unit with 15 min checks.

## 2017-12-09 NOTE — Progress Notes (Signed)
Recreation Therapy Notes  Date: 12.2.19 Time: 1000 Location: 500 Hall Dayroom  Group Topic: Coping Skills  Goal Area(s) Addresses:  Patient will be able to identify positive coping skills. Patient will be able to identify the benefit of using coping skills post d/c.  Intervention: Worksheet  Activity: OrthoptistWeb Design.  Patients were to identify the things that have kept them "stuck" and write them inside the spider web.  Patients were to then write as many positive coping skills they could think of on the outside of the web.  Education: PharmacologistCoping Skills, Building control surveyorDischarge Planning.   Education Outcome: Acknowledges understanding/In group clarification offered/Needs additional education.   Clinical Observations/Feedback: Pt did not attend group.     Caroll RancherMarjette Chanell Nadeau, LRT/CTRS     Caroll RancherLindsay, Yvonda Fouty A 12/09/2017 11:46 AM

## 2017-12-09 NOTE — Plan of Care (Addendum)
Patient has been calm and pleasant on the unit. Denies SI HI AVH. Denies any physical pain. Patient is compliant with medications prescribed per provider. No side effects noted. Safety is maintained with 15 minute checks as well as environmental checks. Will continue to monitor.  Problem: Education: Goal: Verbalization of understanding the information provided will improve Outcome: Progressing   Problem: Activity: Goal: Interest or engagement in activities will improve Outcome: Progressing   Problem: Coping: Goal: Ability to verbalize frustrations and anger appropriately will improve Outcome: Progressing   Problem: Health Behavior/Discharge Planning: Goal: Compliance with treatment plan for underlying cause of condition will improve Outcome: Progressing   Problem: Safety: Goal: Periods of time without injury will increase Outcome: Progressing  Patient remains safe on the unit.

## 2017-12-10 MED ORDER — HYDROCORTISONE 1 % EX CREA
TOPICAL_CREAM | Freq: Two times a day (BID) | CUTANEOUS | Status: DC | PRN
  Filled 2017-12-10: qty 28

## 2017-12-10 MED ORDER — DIPHENHYDRAMINE HCL 50 MG PO CAPS
50.0000 mg | ORAL_CAPSULE | Freq: Once | ORAL | Status: AC
  Administered 2017-12-10: 50 mg via ORAL
  Filled 2017-12-10: qty 1
  Filled 2017-12-10: qty 2

## 2017-12-10 MED ORDER — DIPHENHYDRAMINE HCL 25 MG PO CAPS
25.0000 mg | ORAL_CAPSULE | Freq: Two times a day (BID) | ORAL | Status: AC
  Administered 2017-12-10 – 2017-12-11 (×3): 25 mg via ORAL
  Filled 2017-12-10 (×6): qty 1

## 2017-12-10 NOTE — Progress Notes (Signed)
[facility] MD Progress Note  12/10/2017 9:12 AM [name]  MRN:  [id number] Subjective:   Patient certainly polite compliant and cooperative and states that he is no longer worried about the microchip in his brain he may be simply minimizing symptoms.  He denies wanting to harm self or others and he can contract fully but denies auditory - visual hallucinations today Principal Problem: Schizophrenia, acute (HCC) Diagnosis: Principal Problem:   Schizophrenia, acute (HCC)  Total Time spent with patient: 20 minutes  Past Medical History:  Past Medical History:  Diagnosis Date  . Allergic rhinitis    mostly spring  . Depression   . Schizophrenia (HCC)   . Strep throat 04/2013   obervation in ED due to high fever, illness    Past Surgical History:  Procedure Laterality Date  . ORCHIOPEXY     bilat, in childhood   Family History:  Family History  Problem Relation Age of Onset  . Other Mother        "tumor of stomach"  . Anemia Mother   . Other Father        unknown  . Diabetes Brother        borderline  . Hypertension Maternal Grandmother   . Cancer Neg Hx   . Stroke Neg Hx   . Heart disease Neg Hx   . Aneurysm Neg Hx     Social History:  Social History   Substance and Sexual Activity  Alcohol Use No     Social History   Substance and Sexual Activity  Drug Use Yes  . Types: Marijuana    Social History   Socioeconomic History  . Marital status: Single    Spouse name: Not on file  . Number of children: Not on file  . Years of education: Not on file  . Highest education level: Not on file  Occupational History  . Not on file  Social Needs  . Financial resource strain: Not on file  . Food insecurity:    Worry: Not on file    Inability: Not on file  . Transportation needs:    Medical: Not on file    Non-medical: Not on file  Tobacco Use  . Smoking status: Former Games developer  . Smokeless tobacco: Never Used  Substance and Sexual Activity  . Alcohol use: No   . Drug use: Yes    Types: Marijuana  . Sexual activity: Yes    Birth control/protection: None  Lifestyle  . Physical activity:    Days per week: Not on file    Minutes per session: Not on file  . Stress: Not on file  Relationships  . Social connections:    Talks on phone: Not on file    Gets together: Not on file    Attends religious service: Not on file    Active member of club or organization: Not on file    Attends meetings of clubs or organizations: Not on file    Relationship status: Not on file  Other Topics Concern  . 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   Lives with parents, from [place], high school at [employer], [place] for a while, dropped out.  Wants to go into HVAC work.  Exercise - plays basketball regularly.  Currently trying to find work to [employer] to return to school.   Prior incarceration x 81mo for theft.  Additional Social History:                         Sleep: Good  Appetite:  Good  Current Medications: Current Facility-Administered Medications  Medication Dose Route Frequency Provider Last Rate Last Dose  . acetaminophen (TYLENOL) tablet 650 mg  650 mg Oral Q6H PRN Nira ConnBerry, Jason A, NP      . alum & mag hydroxide-simeth (MAALOX/MYLANTA) 200-200-20 MG/5ML suspension 30 mL  30 mL Oral Q4H PRN Nira ConnBerry, Jason A, NP      . benztropine (COGENTIN) tablet 0.5 mg  0.5 mg Oral BID Malvin JohnsFarah, Valeta Paz, MD   0.5 mg at 12/10/17 0747  . LORazepam (ATIVAN) tablet 2 mg  2 mg Oral Q6H PRN Cobos, Rockey SituFernando A, MD       Or  . LORazepam (ATIVAN) injection 2 mg  2 mg Intramuscular Q6H PRN Cobos, Rockey SituFernando A, MD      . magnesium hydroxide (MILK OF MAGNESIA) suspension 30 mL  30 mL Oral Daily PRN Nira ConnBerry, Jason A, NP      . nicotine (NICODERM CQ - dosed in mg/24 hours) patch 21 mg  21 mg Transdermal Daily Oneta RackLewis, Tanika N, NP      . OLANZapine (ZYPREXA) injection 5 mg  5 mg Intramuscular Q8H PRN Cobos,  Rockey SituFernando A, MD       Or  . OLANZapine zydis (ZYPREXA) disintegrating tablet 5 mg  5 mg Oral Q8H PRN Cobos, Rockey SituFernando A, MD      . prenatal multivitamin tablet 1 tablet  1 tablet Oral Q1200 Malvin JohnsFarah, Neng Albee, MD   1 tablet at 12/09/17 1207  . propranolol (INDERAL) tablet 10 mg  10 mg Oral BID Malvin JohnsFarah, Kristyanna Barcelo, MD   10 mg at 12/10/17 0747  . risperiDONE (RISPERDAL) tablet 2 mg  2 mg Oral BID Malvin JohnsFarah, Willie Plain, MD   2 mg at 12/10/17 0747    Lab Results:  Results for orders placed or performed during the hospital encounter of 12/08/17 (from the past 48 hour(s))  CBC with Differential     Status: None   Collection Time: 12/08/17  5:02 PM  Result Value Ref Range   WBC 5.3 4.0 - 10.5 K/uL   RBC 4.79 4.22 - 5.81 MIL/uL   Hemoglobin 13.8 13.0 - 17.0 g/dL   HCT 14.742.9 82.939.0 - 56.252.0 %   MCV 89.6 80.0 - 100.0 fL   MCH 28.8 26.0 - 34.0 pg   MCHC 32.2 30.0 - 36.0 g/dL   RDW 13.012.1 86.511.5 - 78.415.5 %   Platelets 284 150 - 400 K/uL   nRBC 0.0 0.0 - 0.2 %   Neutrophils Relative % 38 %   Neutro Abs 2.0 1.7 - 7.7 K/uL   Lymphocytes Relative 46 %   Lymphs Abs 2.5 0.7 - 4.0 K/uL   Monocytes Relative 12 %   Monocytes Absolute 0.6 0.1 - 1.0 K/uL   Eosinophils Relative 4 %   Eosinophils Absolute 0.2 0.0 - 0.5 K/uL   Basophils Relative 0 %   Basophils Absolute 0.0 0.0 - 0.1 K/uL   Immature Granulocytes 0 %   Abs Immature Granulocytes 0.01 0.00 - 0.07 K/uL    Comment: Performed at [facility], [address] [place], [place] [address]  Basic metabolic panel     Status: Abnormal   Collection Time: 12/08/17  5:02 PM  Result Value Ref Range   Sodium 138 135 - 145 mmol/L   Potassium 3.2 (L) 3.5 -  5.1 mmol/L   Chloride 103 98 - 111 mmol/L   CO2 25 22 - 32 mmol/L   Glucose, Bld 87 70 - 99 mg/dL   BUN 12 6 - 20 mg/dL   Creatinine, Ser 9.14 0.61 - 1.24 mg/dL   Calcium 9.0 8.9 - 78.2 mg/dL   GFR calc non Af Amer >60 >60 mL/min   GFR calc Af Amer >60 >60 mL/min   Anion gap 10 5 - 15    Comment: Performed at  [facility], [address] [place], [place] [address]  Magnesium     Status: None   Collection Time: 12/08/17  5:02 PM  Result Value Ref Range   Magnesium 2.3 1.7 - 2.4 mg/dL    Comment: Performed at [facility], [address], [place], [place] [address]    Blood Alcohol level:  Lab Results  Component Value Date   [facility] <10 12/07/2017   ETH <10 04/11/2017    Metabolic Disorder Labs: Lab Results  Component Value Date   HGBA1C 5.5 04/13/2017   MPG 111.15 04/13/2017   Lab Results  Component Value Date   PROLACTIN 25.2 (H) 04/13/2017   Lab Results  Component Value Date   CHOL 199 04/13/2017   TRIG 71 04/13/2017   HDL 45 04/13/2017   CHOLHDL 4.4 04/13/2017   VLDL 14 04/13/2017   LDLCALC 140 (H) 04/13/2017   LDLCALC 98 09/14/2016    Physical Findings: AIMS: Facial and Oral Movements Muscles of Facial Expression: None, normal Lips and Perioral Area: None, normal Jaw: None, normal Tongue: None, normal,Extremity Movements Upper (arms, wrists, hands, fingers): None, normal Lower (legs, knees, ankles, toes): None, normal, Trunk Movements Neck, shoulders, hips: None, normal, Overall Severity Severity of abnormal movements (highest score from questions above): None, normal Incapacitation due to abnormal movements: None, normal Patient's awareness of abnormal movements (rate only patient's report): No Awareness, Dental Status Current problems with teeth and/or dentures?: No Does patient usually wear dentures?: No  CIWA:    COWS:     Musculoskeletal: Strength & Muscle Tone: within normal limits Gait & Station: normal Patient leans: N/A  Psychiatric Specialty Exam: Physical Exam  ROS  Blood pressure (!) 143/83, pulse (!) 124, temperature 98 F (36.7 C), temperature source Oral, resp. rate 18, height 6\' 1"  (1.854 m), weight 95.7 kg, SpO2 100 %.Body mass index is 27.84 kg/m.  General Appearance: Casual  Eye Contact:  Good   Speech:  Clear and Coherent  Volume:  Normal  Mood:  Euthymic  Affect:  Congruent  Thought Process:  Goal Directed  Orientation:  Full (Time, Place, and Person)  Thought Content:  Paranoid Ideation  Suicidal Thoughts:  No  Homicidal Thoughts:  No  Memory:  Immediate;   Good  Judgement:  Good  Insight:  Good  Psychomotor Activity:  Normal  Concentration:  Concentration: Good  Recall:  Good  Fund of Knowledge:  Good  Language:  Good  Akathisia:  Negative  Handed:  Right  AIMS (if indicated):     Assets:  Communication Skills  ADL's:  Intact  Cognition:  WNL  Sleep:  Number of Hours: 3    meds adjusted cog 1-1  Treatment Plan Summary: Daily contact with patient to assess and evaluate symptoms and progress in treatment and Medication management  Felma Pfefferle, MD 12/10/2017, 9:12 AM

## 2017-12-10 NOTE — Progress Notes (Deleted)
[facility] INPATIENT:  Family/Significant Other Suicide Prevention Education  Suicide Prevention Education:  Contact Attempts: [name], mother, [phone], has been identified by the patient as the family member/significant other with whom the patient will be residing, and identified as the person(s) who will aid the patient in the event of a mental health crisis.  With written consent from the patient, two attempts were made to provide suicide prevention education, prior to and/or following the patient's discharge.  We were unsuccessful in providing suicide prevention education.  A suicide education pamphlet was given to the patient to share with family/significant other.  Date and time of first attempt:12/09/17, 1506 Date and time of second attempt: 12/10/17 at 12:10pm            Left HIPPA compliant voicemail

## 2017-12-10 NOTE — Progress Notes (Signed)
Recreation Therapy Notes  Date: 12.3.19 Time: 1000 Location: 500 Hall Dayroom  Group Topic: Self-Esteem  Goal Area(s) Addresses:  Patient will successfully identify positive attributes about themselves.  Patient will successfully identify benefit of improved self-esteem.   Behavioral Response: Engaged  Intervention: Paper, colored pencils  Activity: Personalized Plates.  Patients were to create a personalized license plate the highlighted positive characteristics, important dates, things they like or just things that set them apart from everyone else.   Education:  Self-Esteem, Building control surveyorDischarge Planning.   Education Outcome: Acknowledges education/In group clarification offered/Needs additional education  Clinical Observations/Feedback: Pt stated self esteem was influenced by "music and time".  Pt expressed on his plate "life is a bunch of beautiful butterflies".  Pt also used a stop sign to represent "the age at which you stop being brilliant but you brain keeps growing" and then asked the question "Am I the brain or the body"?  Also the b flat symbol, which pt stated was his favorite symbol in music and that it was the only symbol in music.  Lastly, pt used grass to represent self esteem.  Pt was rambling about how he was the smartest person ever on earth.  Pt also expressed that hip hop "freed the world" and people were judging K[place]nick because of his color and that because Kapernick has an afro that solidifies his blackness.     Caroll RancherMarjette Anselm Aumiller, LRT/CTRS     Lillia AbedLindsay, Sebastian Dzik A 12/10/2017 11:06 AM

## 2017-12-10 NOTE — [facility] Group Notes (Signed)
LCSW Group Therapy Note 12/10/2017 11:34 AM  Type of Therapy and Topic: Group Therapy: DBT House  Participation Level: Active   Description of Group:  In this group patients will be encouraged to explore  their values, behaviors they want to change, emotions they wish to increase, protective factors, supports, coping skills, and motivational factors. They will be guided to discuss their thoughts, feelings, and behaviors related to these obstacles. The group will be asked to individually process the activity and share their insights with the group. This group will be process-oriented, with patients participating in exploration of their own experiences as well as giving and receiving support and challenge from other group members.   Therapeutic Goals: 1. Patient will identify their values 2. Patient will identify behaviors they wish to modify  3. Patient will identify feelings and emotions they wish to increase 4. Patient will identify strengths, supports, protective factors 5. Patient will identify coping skills 6. Patient will identify goals and motivating factors for change   Summary of Patient Progress Patient was very active in group, often rambling off topic, but pleasant and redirectable. Patient initially stated his kidneys are an emotion he wishes to increase but later shared that he wants to increase his fear, a healthy fear. Patient stated his protectors are the moon, because the moon keeps his secrets, and the trees because they provide oxygen. Patient was able to identify coping skills of playing video games and a goal he would like to accomplish is running a 5k.  Therapeutic Modalities:  Dialectical Behavioral Therapy  Motivational Interviewing Relapse Prevention Therapy

## 2017-12-10 NOTE — [facility] Suicide Risk Assessment (Signed)
[facility] INPATIENT:  Family/Significant Other Suicide Prevention Education  Suicide Prevention Education:  Contact Attempts: Lynette Steward, mother, [phone], has been identified by the patient as the family member/significant other with whom the patient will be residing, and identified as the person(s) who will aid the patient in the event of a mental health crisis.  With written consent from the patient, two attempts were made to provide suicide prevention education, prior to and/or following the patient's discharge.  We were unsuccessful in providing suicide prevention education.  A suicide education pamphlet was given to the patient to share with family/significant other.  Date and time of first attempt:12/09/17, 1506 Date and time of second attempt: 12/10/17 at 12:10pm            Left HIPPA compliant voicemail 

## 2017-12-10 NOTE — Progress Notes (Signed)
D: Pt denies SI/HI/AVH. Pt is pleasant and cooperative. Pt stated he was having issue with his face itching. Pt ordered Hydrocortisone cream for face. Pt continues to be paranoid.     A: Pt was offered support and encouragement. Pt was given scheduled medications. Pt was encourage to attend groups. Q 15 minute checks were done for safety.   R: safety maintained on unit.   Problem: Activity: Goal: Interest or engagement in activities will improve Outcome: Progressing   Problem: Activity: Goal: Sleeping patterns will improve Outcome: Progressing

## 2017-12-10 NOTE — Plan of Care (Signed)
Progress note  D: pt found in the dayroom; compliant with medication administration. Pt rates his depression/hopelessness/anxiety a 10/0/0 out of 10 respectively. Pt states he has had passive thoughts of hurting himself on his self inventory. Pt denied any si/hi/ah/vh to this Clinical research associatewriter and verbally agreed to approach staff if these become apparent or before [facility]. Pt states he is having pain on the right side of his face where he had a rash breakout, rating this pain a 10/10. Pt states getting rid of this rash is his goal for today and but doesn't know how he will achieve this. Pt has been pleasant on the hall but isolative. A: pt provided support and encouragement. Pt given medication per protocol and standing orders. [id number] safety checks implemented and continued.  R: pt safe on the unit. Will continue to monitor.   Pt progressing in the following metrics  Problem: Education: Goal: Knowledge of [facility] Education information/materials will improve Outcome: Progressing Goal: Emotional status will improve Outcome: Progressing Goal: Mental status will improve Outcome: Progressing   Problem: Activity: Goal: Sleeping patterns will improve Outcome: Progressing

## 2017-12-11 NOTE — Progress Notes (Signed)
Patient's mother, Sidonie DickensLynette Aken, phone [phone], would like call from SW/MD about patient's discharge date and patient's status.

## 2017-12-11 NOTE — Progress Notes (Signed)
D:  Patient's self inventory sheet, patient has fair sleep, no sleep medication.  Good appetite, normal energy level, good concentration.  Denied depression, hopeless and anxiety.  Denied withdrawals.  Denied SI.  Denied physical problems.  Physical pain, face, worst pain in past 24 hours is #10.  Goal is discharge plan.  Plans to ask questions.  Does have discharge plans. A:  Medications administered per MD orders.  Emotional support and encouragement given patient. R:  Denied SI and HI, contracts for safety.  Denied A/V hallucinations.  Denied pain.  Safety maintained with 15 minute checks.

## 2017-12-11 NOTE — Plan of Care (Signed)
Nurse discussed anxiety, depression and coping skills with patient.  

## 2017-12-11 NOTE — Progress Notes (Signed)
Did not attend group 

## 2017-12-11 NOTE — Progress Notes (Signed)
Nursing Progress Note: 7p-7a D: Pt currently presents with a anxious/depressed affect and behavior. Interacting minimally with the milieu. Pt reports good sleep during the previous night with current medication regimen. Pt did attend wrap-up group.  A: Pt provided with medications per providers orders. Pt's labs and vitals were monitored throughout the night. Pt supported emotionally and encouraged to express concerns and questions. Pt educated on medications.  R: Pt's safety ensured with 15 minute and environmental checks. Pt currently denies SI, HI, and AVH. Pt verbally contracts to seek staff if SI,HI, or AVH occurs and to consult with staff before acting on any harmful thoughts. Will continue to monitor.

## 2017-12-11 NOTE — Progress Notes (Signed)
[facility] MD Progress Note  12/11/2017 10:34 AM [name]  MRN:  [id number] Subjective:   This is the third hospital day for [name] a 27 year old patient who present with delusions and possibly hallucinations he is no longer expressing the previously expressed concerns about a microchip in his brain he does seem to be genuinely improved as far as his delusions he denies wanting to harm himself and he denies wanting to harm others and he can contract for safety here. No EPS or TD noted Compliant with meds and participating as best he can in groups. At with members of the treatment team to express his goal and he is overall improved and seeking med adjustments.  Principal Problem: Schizophrenia, acute (HCC) Diagnosis: Principal Problem:   Schizophrenia, acute (HCC)  Total Time spent with patient: 20 minutes  Past Medical History:  Past Medical History:  Diagnosis Date  . Allergic rhinitis    mostly spring  . Depression   . Schizophrenia (HCC)   . Strep throat 04/2013   obervation in ED due to high fever, illness    Past Surgical History:  Procedure Laterality Date  . ORCHIOPEXY     bilat, in childhood   Family History:  Family History  Problem Relation Age of Onset  . Other Mother        "tumor of stomach"  . Anemia Mother   . Other Father        unknown  . Diabetes Brother        borderline  . Hypertension Maternal Grandmother   . Cancer Neg Hx   . Stroke Neg Hx   . Heart disease Neg Hx   . Aneurysm Neg Hx    Social History:  Social History   Substance and Sexual Activity  Alcohol Use No     Social History   Substance and Sexual Activity  Drug Use Yes  . Types: Marijuana    Social History   Socioeconomic History  . Marital status: Single    Spouse name: Not on file  . Number of children: Not on file  . Years of education: Not on file  . Highest education level: Not on file  Occupational History  . Not on file  Social Needs  . Financial  resource strain: Not on file  . Food insecurity:    Worry: Not on file    Inability: Not on file  . Transportation needs:    Medical: Not on file    Non-medical: Not on file  Tobacco Use  . Smoking status: Former Games developer  . Smokeless tobacco: Never Used  Substance and Sexual Activity  . Alcohol use: No  . Drug use: Yes    Types: Marijuana  . Sexual activity: Yes    Birth control/protection: None  Lifestyle  . Physical activity:    Days per week: Not on file    Minutes per session: Not on file  . Stress: Not on file  Relationships  . Social connections:    Talks on phone: Not on file    Gets together: Not on file    Attends religious service: Not on file    Active member of club or organization: Not on file    Attends meetings of clubs or organizations: Not on file    Relationship status: Not on file  Other Topics Concern  . 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   Lives  with parents, from [place], high school at [employer] for a while, dropped out.  Wants to go into HVAC work.  Exercise - plays basketball regularly.  Currently trying to find work to [employer] bill at [employer] to return to school.   Prior incarceration x 45mo for theft.     Additional Social History:                         Sleep: Good  Appetite:  Good  Current Medications: Current Facility-Administered Medications  Medication Dose Route Frequency Provider Last Rate Last Dose  . acetaminophen (TYLENOL) tablet 650 mg  650 mg Oral Q6H PRN Nira ConnBerry, Jason A, NP      . alum & mag hydroxide-simeth (MAALOX/MYLANTA) 200-200-20 MG/5ML suspension 30 mL  30 mL Oral Q4H PRN Nira ConnBerry, Jason A, NP      . benztropine (COGENTIN) tablet 0.5 mg  0.5 mg Oral BID Malvin JohnsFarah, Jozef Eisenbeis, MD   0.5 mg at 12/11/17 0820  . hydrocortisone cream 1 %   Topical BID PRN Nira ConnBerry, Jason A, NP      . LORazepam (ATIVAN) tablet 2 mg  2 mg Oral Q6H PRN Cobos, Rockey SituFernando A, MD       Or   . LORazepam (ATIVAN) injection 2 mg  2 mg Intramuscular Q6H PRN Cobos, Rockey SituFernando A, MD      . magnesium hydroxide (MILK OF MAGNESIA) suspension 30 mL  30 mL Oral Daily PRN Nira ConnBerry, Jason A, NP      . nicotine (NICODERM CQ - dosed in mg/24 hours) patch 21 mg  21 mg Transdermal Daily Oneta RackLewis, Tanika N, NP      . OLANZapine (ZYPREXA) injection 5 mg  5 mg Intramuscular Q8H PRN Cobos, Rockey SituFernando A, MD       Or  . OLANZapine zydis (ZYPREXA) disintegrating tablet 5 mg  5 mg Oral Q8H PRN Cobos, Rockey SituFernando A, MD      . prenatal multivitamin tablet 1 tablet  1 tablet Oral Q1200 Malvin JohnsFarah, Izeyah Deike, MD   1 tablet at 12/10/17 1204  . propranolol (INDERAL) tablet 10 mg  10 mg Oral BID Malvin JohnsFarah, Loren Sawaya, MD   10 mg at 12/11/17 0819  . risperiDONE (RISPERDAL) tablet 2 mg  2 mg Oral BID Malvin JohnsFarah, Genise Strack, MD   2 mg at 12/11/17 [id number]    Lab Results: No results found for this or any previous visit (from the past 48 hour(s)).  Blood Alcohol level:  Lab Results  Component Value Date   ETH <10 12/07/2017   ETH <10 04/11/2017    Metabolic Disorder Labs: Lab Results  Component Value Date   HGBA1C 5.5 04/13/2017   MPG 111.15 04/13/2017   Lab Results  Component Value Date   PROLACTIN 25.2 (H) 04/13/2017   Lab Results  Component Value Date   CHOL 199 04/13/2017   TRIG 71 04/13/2017   HDL 45 04/13/2017   CHOLHDL 4.4 04/13/2017   VLDL 14 04/13/2017   LDLCALC 140 (H) 04/13/2017   LDLCALC 98 09/14/2016    Physical Findings: AIMS: Facial and Oral Movements Muscles of Facial Expression: None, normal Lips and Perioral Area: None, normal Jaw: None, normal Tongue: None, normal,Extremity Movements Upper (arms, wrists, hands, fingers): None, normal Lower (legs, knees, ankles, toes): None, normal, Trunk Movements Neck, shoulders, hips: None, normal, Overall Severity Severity of abnormal movements (highest score from questions above): None, normal Incapacitation due to abnormal movements: None, normal Patient's awareness of  abnormal movements (rate only patient's  report): No Awareness, Dental Status Current problems with teeth and/or dentures?: No Does patient usually wear dentures?: No  CIWA:    COWS:     Musculoskeletal: Strength & Muscle Tone: within normal limits Gait & Station: normal Patient leans: N/A  Psychiatric Specialty Exam: Physical Exam  ROS  Blood pressure 134/80, pulse 97, temperature 98 F (36.7 C), temperature source Oral, resp. rate 18, height 6\' 1"  (1.854 m), weight 95.7 kg, SpO2 100 %.Body mass index is 27.84 kg/m.  General Appearance: Casual  Eye Contact:  Good  Speech:  Clear and Coherent  Volume:  Normal  Mood:  Euthymic  Affect:  Appropriate  Thought Process:  Goal Directed  Orientation:  Full (Time, Place, and Person)  Thought Content:  Delusions  Suicidal Thoughts:  No  Homicidal Thoughts:  No  Memory:  Immediate;   Good  Judgement:  Good  Insight:  Good  Psychomotor Activity:  Normal  Concentration:  Concentration: Good  Recall:  Good  Fund of Knowledge:  Good  Language:  Good  Akathisia:  Negative  Handed:  Right  AIMS (if indicated):     Assets:  Communication Skills Desire for Improvement  ADL's:  Intact  Cognition:  nl  Sleep:  Number of Hours: 6   In summary [name] is showing improvement he is responding to the low-dose antipsychotic therapy.  He is responding to reality based therapy.  Continue current measures and precautions may go in 1 to 2 days  Treatment Plan Summary: Daily contact with patient to assess and evaluate symptoms and progress in treatment and Medication management  Malvin Johns, MD 12/11/2017, 10:34 AM

## 2017-12-11 NOTE — Progress Notes (Signed)
Recreation Therapy Notes  Date: 12.4.19 Time: 1000 Location: [address] Dayroom  Group Topic: Communication, Team Building, Problem Solving  Goal Area(s) Addresses:  Patient will effectively work with peer towards shared goal.  Patient will identify skill used to make activity successful.  Patient will identify how skills used during activity can be used to reach post d/c goals.   Intervention: STEM Activity   Activity: In team's, using 20 small plastic cups, patients were asked to build the tallest free standing tower possible.    Education: Pharmacist, communityocial Skills, Building control surveyorDischarge Planning.   Education Outcome: Acknowledges education/In group clarification offered/Needs additional education.   Clinical Observations/Feedback: Pt did not attend group.     Caroll RancherMarjette Savannaha Stonerock, LRT/CTRS         Caroll RancherLindsay, Kristen Fromm A 12/11/2017 11:52 AM

## 2017-12-12 MED ORDER — HYDROCORTISONE 1 % EX CREA: TOPICAL_CREAM | Freq: Two times a day (BID) | CUTANEOUS | 0 refills | Status: DC | PRN

## 2017-12-12 MED ORDER — RISPERIDONE 2 MG PO TABS: 2.0000 mg | ORAL_TABLET | Freq: Two times a day (BID) | ORAL | 0 refills | Status: AC

## 2017-12-12 MED ORDER — PROPRANOLOL HCL 10 MG PO TABS: 10.0000 mg | ORAL_TABLET | Freq: Two times a day (BID) | ORAL | 0 refills | Status: AC

## 2017-12-12 MED ORDER — TRAZODONE HCL 100 MG PO TABS: 100.0000 mg | ORAL_TABLET | Freq: Every evening | ORAL | 0 refills | Status: AC | PRN

## 2017-12-12 MED ORDER — BENZTROPINE MESYLATE 0.5 MG PO TABS: 0.5000 mg | ORAL_TABLET | Freq: Two times a day (BID) | ORAL | 0 refills | Status: AC

## 2017-12-12 MED ORDER — NICOTINE 21 MG/24HR TD PT24: 21.0000 mg | MEDICATED_PATCH | Freq: Every day | TRANSDERMAL | 0 refills | Status: DC

## 2017-12-12 MED ORDER — PRENATAL MULTIVITAMIN CH: 1.0000 | ORAL_TABLET | Freq: Every day | ORAL | Status: DC

## 2017-12-12 NOTE — Discharge Summary (Signed)
Physician Discharge Summary Note  Patient:  [name] is an 27 y.o., male  MRN:  [id number]  DOB:  Sep 12, 1990  Patient phone:  [phone] (home)   Patient address[place]   [address] [place] [address],   Total Time spent with patient: Greater than 30 minutes  Date of Admission:  12/08/2017  Date of Discharge: 12-12-17  Reason for Admission: Worsening delusions.  Principal Problem: Schizophrenia, acute [facility](HCC)  Discharge Diagnoses: Patient Active Problem List   Diagnosis Date Noted  . Schizophrenia, acute (HCC) [F23] 04/12/2017  . Psychosis (HCC) [F29] 01/17/2017  . 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 Screen for STD (sexually transmitted disease) [Z11.3] 09/14/2016  . Hyperesthesia [R20.3] 09/14/2016  . Testicular pain [N50.819] 09/14/2016  . Burning with urination [R30.0] 09/14/2016  . Left ear pain [H92.02] 09/14/2016  . Muscle strain [T14.8XXA] 11/09/2015  . Strain of back [S39.012A] 11/09/2015  . Back spasm [M62.830] 11/09/2015   Past Psychiatric History: MDD, psychosis.  Past Medical History:  Past Medical History:  Diagnosis Date  . Allergic rhinitis    mostly spring  . Depression   . Schizophrenia (HCC)   . Strep throat 04/2013   obervation in ED due to high fever, illness    Past Surgical History:  Procedure Laterality Date  . ORCHIOPEXY     bilat, in childhood   Family History:  Family History  Problem Relation Age of Onset  . Other Mother        "tumor of stomach"  . Anemia Mother   . Other Father        unknown  . Diabetes Brother        borderline  . Hypertension Maternal Grandmother   . Cancer Neg Hx   . Stroke Neg Hx   . Heart disease Neg Hx   . Aneurysm Neg Hx    Family Psychiatric  History: See H&P  Social History:  Social History   Substance and Sexual Activity  Alcohol Use No     Social History   Substance and Sexual Activity  Drug Use Yes  . Types:  Marijuana    Social History   Socioeconomic History  . Marital status: Single    Spouse name: Not on file  . Number of children: Not on file  . Years of education: Not on file  . Highest education level: Not on file  Occupational History  . Not on file  Social Needs  . Financial resource strain: Not on file  . Food insecurity:    Worry: Not on file    Inability: Not on file  . Transportation needs:    Medical: Not on file    Non-medical: Not on file  Tobacco Use  . Smoking status: Former Games developermoker  . Smokeless tobacco: Never Used  Substance and Sexual Activity  . Alcohol use: No  . Drug use: Yes    Types: Marijuana  . Sexual activity: Yes    Birth control/protection: None  Lifestyle  . Physical activity:    Days per week: Not on file    Minutes per session: Not on file  . Stress: Not on file  Relationships  . Social connections:    Talks on phone: Not on file    Gets together: Not on file    Attends religious service: Not on file    Active member of club or organization: Not on file    Attends meetings of clubs or organizations:  Not on file    Relationship status: Not on file  Other Topics Concern  . 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   Lives with parents, from [place], high school at [employer], [place] for a while, dropped out.  Wants to go into HVAC work.  Exercise - plays basketball regularly.  Currently trying to find work to [employer] to return to school.   Prior incarceration x 36mo for theft.     Hospital Course: (Per Md's admission evaluation): 27, single, no children, lives with mother, unemployed. Presented to ED with his mother. Patient is a limited historian at this time, reports he came to ED because he fell and has felt tired. Patient states he has a microchip in the back of his head and that if affects his behavior. He denies hallucinations. At this time presents calm, sedated. As  per chart notes, his mother reported neighbors had contacted police because patient was presenting with disorganized behavior, internally preoccupied/hallucinations, found outside wearing shorts and running and yelling in the rain, and not sleeping or eating regularly  x several days. In ED was initially exhibiting agitated, disorganized behaviors. Patient reports he has been taking medications but is unable to identify  when he last took them. As per chart notes, has been on oral Invega at 9 mgrs QDAY and on  [place] IM monthly. (At this time it is unclear when patient last received this medication.) He denies drug or alcohol abuse and admission BAL/UDS are  negative. He has a history of psychiatric illness and had prior psychiatric admission to [facility] in April 2019. At the time was admitted due to  worsening psychosis, disorganized behaviors, agitation, with similar  delusion of having an implanted microchip. He denies medical illnesses.  After the above admission evaluation, [name] was started on the medication regimen for his presenting symptoms. He received & was discharged on;  Benztropine 0.5 mg for EPS, Nicotine patch 21 mg for smoking cessation, Propranolol 2 mg for anxiety, Risperdal 2 mg for mood control & Trazodone 100 mg for insomnia. He was enrolled & participated in the group counseling sessions being being offered & held on this unit. He learned coping skills. He presented no other significant health issues that required treatment or monitoring. He tolerated his treatment regimen without any adverse effects or reactions reported.  Today upon his discharge evaluation with the attending psychiatrist, pt shares,"I'm doing pretty good." He denies any specific concerns & states that he feels ready to be discharged to his place of residence. He denies any physical complaints. He denies any SI/HI/VH. He is sleeping well. His appetite is good. He is in agreement to continue his current treatment  regimen without changes. He is also in agreement to continue at the [facility] for outpatient follow up care & medication management. He was able to engage in safety planning including plan to return to [facility] or contact emergency services if he feels unable to maintain his own safety or the safety of others. Pt had no further questions, comments, or concerns.  Upon discharge, [name] presents both mentally & medically stable. He is being discharged & recommended to continue mental health care on an outpatient basis as noted below. He is provided with all the necessary information needed to make this appointments without problem.  He left [facility] in no apparent distress.  Physical Findings: AIMS: Facial and Oral Movements Muscles of  Facial Expression: None, normal Lips and Perioral Area: None, normal Jaw: None, normal Tongue: None, normal,Extremity Movements Upper (arms, wrists, hands, fingers): None, normal Lower (legs, knees, ankles, toes): None, normal, Trunk Movements Neck, shoulders, hips: None, normal, Overall Severity Severity of abnormal movements (highest score from questions above): None, normal Incapacitation due to abnormal movements: None, normal Patient's awareness of abnormal movements (rate only patient's report): No Awareness, Dental Status Current problems with teeth and/or dentures?: No Does patient usually wear dentures?: No  CIWA:  CIWA-Ar Total: 0 COWS:  COWS Total Score: 1  Musculoskeletal: Strength & Muscle Tone: within normal limits Gait & Station: normal Patient leans: N/A  Psychiatric Specialty Exam: Physical Exam  Constitutional: He is oriented to person, place, and time. He appears well-developed.  HENT:  Head: Normocephalic.  Eyes: Pupils are equal, round, and reactive to light.  Neck: Normal range of motion.  Cardiovascular: Normal rate.  Respiratory: Effort normal.  GI: Soft.  Genitourinary:  Genitourinary Comments: Deferred   Musculoskeletal: Normal range of motion.  Neurological: He is alert and oriented to person, place, and time.  Skin: Skin is warm and dry.    Review of Systems  Constitutional: Negative.   HENT: Negative.   Eyes: Negative.   Respiratory: Negative.   Cardiovascular: Negative.   Gastrointestinal: Negative.   Genitourinary: Negative.   Musculoskeletal: Negative.   Skin: Negative.   Neurological: Negative.   Endo/Heme/Allergies: Negative.   Psychiatric/Behavioral: Positive for depression (Stable) and hallucinations (Hx. Psychosis). Negative for memory loss, substance abuse and suicidal ideas. The patient has insomnia (Stable). The patient is not nervous/anxious.     Blood pressure 123/76, pulse (!) 111, temperature 98.4 F (36.9 C), temperature source Oral, resp. rate 18, height 6\' 1"  (1.854 m), weight 95.7 kg, SpO2 100 %.Body mass index is 27.84 kg/m.  See H&P   Have you used any form of tobacco in the last 30 days? (Cigarettes, Smokeless Tobacco, Cigars, and/or Pipes): Yes  Has this patient used any form of tobacco in the last 30 days? (Cigarettes, Smokeless Tobacco, Cigars, and/or Pipes): Yes, an FDA-approved tobacco cessation medication was offered at discharge.  Blood Alcohol level:  Lab Results  Component Value Date   ETH <10 12/07/2017   ETH <10 04/11/2017   Metabolic Disorder Labs:  Lab Results  Component Value Date   HGBA1C 5.5 04/13/2017   MPG 111.15 04/13/2017   Lab Results  Component Value Date   PROLACTIN 25.2 (H) 04/13/2017   Lab Results  Component Value Date   CHOL 199 04/13/2017   TRIG 71 04/13/2017   HDL 45 04/13/2017   CHOLHDL 4.4 04/13/2017   VLDL 14 04/13/2017   LDLCALC 140 (H) 04/13/2017   LDLCALC 98 09/14/2016    See Psychiatric Specialty Exam and Suicide Risk Assessment completed by Attending Physician prior to discharge.  Discharge destination:  Home  Is patient on multiple antipsychotic therapies at discharge:  No   Has Patient had three  or more failed trials of antipsychotic monotherapy by history:  No  Recommended Plan for Multiple Antipsychotic Therapies: NA  Allergies as of 12/12/2017   No Known Allergies     Medication List    STOP taking these medications   paliperidone 156 MG/ML Susp injection Commonly known as:  INVEGA SUSTENNA   paliperidone 9 MG 24 hr tablet Commonly known as:  INVEGA     TAKE these medications     Indication  benztropine 0.5 MG tablet Commonly known as:  COGENTIN Take 1 tablet (  0.5 mg total) by mouth 2 (two) times daily. For prevention of drug induced tremors.  Indication:  Extrapyramidal Reaction caused by Medications   hydrocortisone cream 1 % Apply topically 2 (two) times daily as needed for itching.  Indication:  Skin Inflammation, Itching   nicotine 21 mg/24hr patch Commonly known as:  NICODERM CQ - dosed in mg/24 hours Place 1 patch (21 mg total) onto the skin daily. (May purchase from over the counter): For smoking cessation  Indication:  Nicotine Addiction   prenatal multivitamin Tabs tablet Take 1 tablet by mouth daily at 12 noon. (May buy from over the counter): For Vitamin replacement  Indication:  Vitamin Deficiency   propranolol 10 MG tablet Commonly known as:  INDERAL Take 1 tablet (10 mg total) by mouth 2 (two) times daily. For anxiety  Indication:  Anxiety   risperiDONE 2 MG tablet Commonly known as:  RISPERDAL Take 1 tablet (2 mg total) by mouth 2 (two) times daily. For mood control  Indication:  Mood control   traZODone 100 MG tablet Commonly known as:  DESYREL Take 1 tablet (100 mg total) by mouth at bedtime as needed for sleep.  Indication:  Trouble Sleeping      Follow-up Information    [facility] Go on 12/16/2017.   Why:  Your next hospital follow up appointment is Thursday, 12/16/17 at 1:00p. Please bring: photo ID, proof of insurance, and discharge paperwork from this hospitalization.  Contact information: [address] [place] [address] [phone]          Follow-up recommendations: Activity:  As tolerated Diet: As recommended by your primary care doctor. Keep all scheduled follow-up appointments as recommended.    Comments: Patient is instructed prior to discharge to: Take all medications as prescribed by his/her mental healthcare provider. Report any adverse effects and or reactions from the medicines to his/her outpatient provider promptly. Patient has been instructed & cautioned: To not engage in alcohol and or illegal drug use while on prescription medicines. In the event of worsening symptoms, patient is instructed to call the crisis hotline, 911 and or go to the nearest ED for appropriate evaluation and treatment of symptoms. To follow-up with his/her primary care provider for your other medical issues, concerns and or health care needs.   Signed: Armandina Stammer, NP, PMHNP, FNP-BC 12/12/2017, 10:42 AM

## 2017-12-12 NOTE — [facility] Suicide Risk Assessment (Signed)
[facility]   Principal Problem: Schizophrenia, acute (HCC) Discharge Diagnoses: Principal Problem:   Schizophrenia, acute (HCC)   Total Time spent with patient: 30 minutes  Musculoskeletal: Strength & Muscle Tone: within normal limits Gait & Station: normal Patient leans: N/A  Psychiatric Specialty Exam: ROS  Blood pressure 123/76, pulse (!) 111, temperature 98.4 F (36.9 C), temperature source Oral, resp. rate 18, height 6\' 1"  (1.854 m), weight 95.7 kg, SpO2 100 %.Body mass index is 27.84 kg/m.  General Appearance: Casual  Eye Contact::  Good  Speech:  Clear and Coherent409  Volume:  Normal  Mood:  Euthymic  Affect:  Appropriate  Thought Process:  Coherent  Orientation:  Full (Time, Place, and Person)  Thought Content:  Logical  Suicidal Thoughts:  No  Homicidal Thoughts:  No  Memory:  Immediate;   Good  Judgement:  Good  Insight:  Good  Psychomotor Activity:  Normal  Concentration:  Good  Recall:  Good  Fund of Knowledge:Good  Language: Good  Akathisia:  Negative  Handed:  Right  AIMS (if indicated):     Assets:  Communication Skills Desire for Improvement  Sleep:  Number of Hours: 6.25  Cognition: WNL  ADL's:  Intact   Mental Status Per Nursing Assessment::   On Admission:  NA  Demographic Factors:  Male  Loss Factors: Decrease in vocational status  Historical Factors: NA  Risk Reduction Factors:   NA  Continued Clinical Symptoms:  Schizophrenia:   Paranoid or undifferentiated type  Cognitive Features That Contribute To Risk:  None    Suicide Risk:  Minimal: No identifiable suicidal ideation.  Patients presenting with no risk factors but with morbid ruminations; may be classified as minimal risk based on the severity of the depressive symptoms  Follow-up Information    [facility] Go on 12/16/2017.   Why:  Your next hospital follow up appointment is Thursday, 12/16/17 at 1:00p. Please bring:  photo ID, proof of insurance, and discharge paperwork from this hospitalization.  Contact information: [address] [place] [address] [phone]           Plan Of Care/Follow-up recommendations:  Activity:  nl  Malvin JohnsFARAH,Faust Thorington, MD 12/12/2017, 8:55 AM

## 2017-12-12 NOTE — Plan of Care (Signed)
Pt engaged and participated in two recreation therapy group sessions.   Caroll RancherMarjette Endora Teresi, LRT/CTRS

## 2017-12-12 NOTE — Progress Notes (Signed)
  [facility] :  Will you be returning to the same living situation after discharge:  Yes,  patient reports he is returning home with his mother At discharge, do you have transportation home?: Yes,  patient reports his mother is picking him up at discharge Do you have the ability to pay for your medications: Yes,  Medicaid  Release of information consent forms completed and in the chart;  Patient's signature needed at discharge.  Patient to Follow up at: Follow-up Information    [facility]. Go on 12/16/2017.   Why:  Your next hospital follow up appointment is Thursday, 12/16/17 at 1:00p. Please bring: photo ID, proof of insurance, and discharge paperwork from this hospitalization.  Contact information: [address] [place] [address] [phone]           Next level of care provider has access to [facility]:yes  Safety Planning and Suicide Prevention discussed: Yes,  with the patient's mother  Have you used any form of tobacco in the last 30 days? (Cigarettes, Smokeless Tobacco, Cigars, and/or Pipes): Yes  Has patient been referred to the Quitline?: Patient refused referral  Patient has been referred for addiction treatment: N/A  Maeola SarahJolan E Jahyra Sukup, LCSWA 12/12/2017, 11:12 AM

## 2017-12-12 NOTE — Progress Notes (Signed)
Recreation Therapy Notes  INPATIENT RECREATION TR PLAN  Patient Details Name: [name] MRN: [id number] DOB: 1990/09/22 Today's Date: 12/12/2017  Rec Therapy Plan Is patient appropriate for Therapeutic Recreation?: Yes Treatment times per week: about 3 days Estimated Length of Stay: 5-7 days TR Treatment/Interventions: Group participation (Comment)  Discharge Criteria Pt will be discharged from therapy if:: Discharged Treatment plan/goals/alternatives discussed and agreed upon by:: Patient/family  Discharge Summary Short term goals set: See patient care plan Short term goals met: Adequate for discharge Progress toward goals comments: Groups attended Which groups?: Goal setting, Self-esteem Reason goals not met: None Therapeutic equipment acquired: N/A Reason patient discharged from therapy: Discharge from hospital Pt/family agrees with progress & goals achieved: Yes Date patient discharged from therapy: 12/12/17    Victorino Sparrow, LRT/CTRS  Ria Comment, Loribeth Katich A 12/12/2017, 11:35 AM

## 2017-12-12 NOTE — Progress Notes (Signed)
Recreation Therapy Notes  Date: 12.5.19 Time: 1000 Location: [address] Dayroom  Group Topic: Goal Setting  Goal Area(s) Addresses:  Patient will be able to identify at least 3 life goals.  Patient will be able to identify benefit of investing in life goals.  Patient will be able to identify benefit of setting life goals.   Behavioral Response:  Engaged  Intervention: Worksheet  Activity: Goal Setting.  Patients were to set goals they want to accomplish within the next week, month, year and five years.  Patients were to also identify the obstacles they would face when trying to achieve their goals, what they need in order to be successful and what they could do immediately to get started towards their goal.  Education:  Discharge Planning, Coping Skills   Education Outcome: Acknowledges Education/In Group Clarification Provided/Needs Additional Education  Clinical Observations: Pt stated goals were to "help you get through the mundane".  Pt also expressed goals are important because they give you something to do.  Pt stated his goals was to visit [facility] in a week; get a new cell phone and start an exercise routine within a month; get a job in a year and get a car and house in five years.  Pt identified his obstacles as "laziness and rest".  Pt expressed what he needs to be successful are a "willingness to sacrifice sleep for success" and what he can start doing tomorrow is "write down why exercise and work are important for my future".     Caroll RancherMarjette Cohan Stipes, LRT/CTRS    Caroll RancherLindsay, Royale Lennartz A 12/12/2017 11:56 AM

## 2017-12-12 NOTE — Progress Notes (Signed)
Patient ID: [name], male   DOB: Jun 13, 1990, 27 y.o.   MRN: [id number] Patient discharged to home/self care in the company of his family.  Patient denies SI, HI and AVH upon discharge. Patient acknowledged understanding of all discharge instructions and receipt of personal belongings. Patient was eager to discharge and stated that he would continue care within the community.

## 2018-05-01 DIAGNOSIS — Z0279 Encounter for issue of other medical certificate: Secondary | ICD-10-CM

## 2018-08-03 IMAGING — CT CT HEAD W/O CM
3 of 4 series · 15 of 47 positions shown, 18 images · non-contrast
Comparison: None.

CLINICAL DATA: 26 y/o  M; left posterior headache.

EXAM:
CT HEAD WITHOUT CONTRAST
TECHNIQUE: Contiguous axial images were obtained from the base of the skull
through the vertex without intravenous contrast.

[Series 2: head w/o · axial · non-contrast · 0.45mm/px · z∈[-126,-1]mm · 9 of 31 slices shown, 12 images]
[im 3/31  brain]
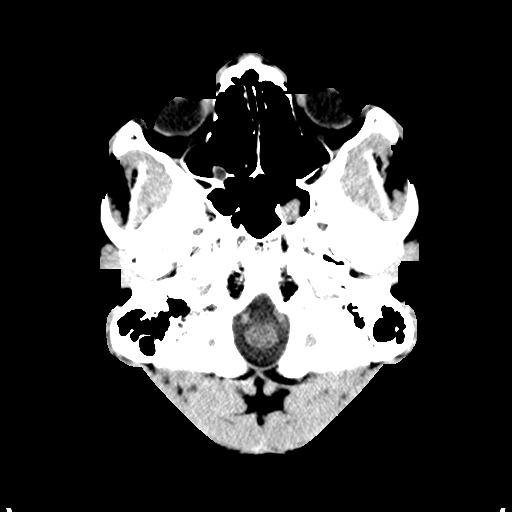
[im 3/31  bone]
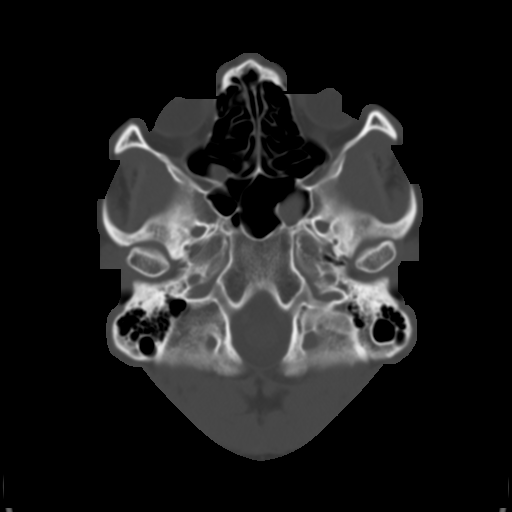
[im 7/31  brain]
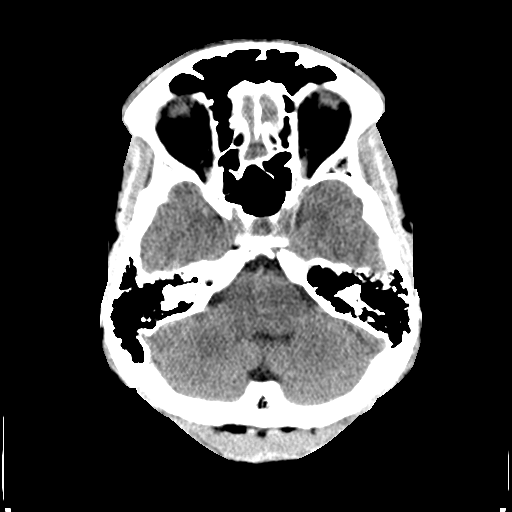
[im 9/31  brain]
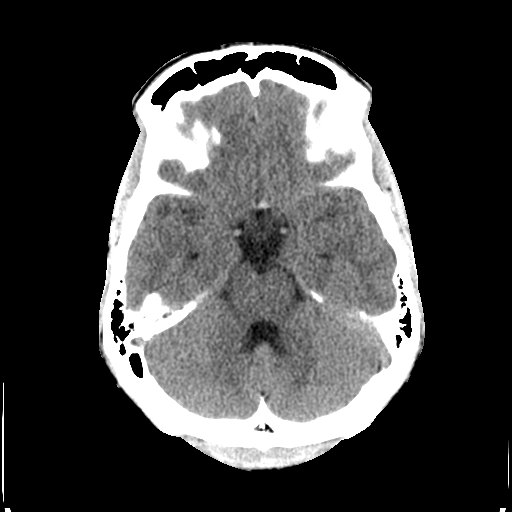
[im 13/31  brain]
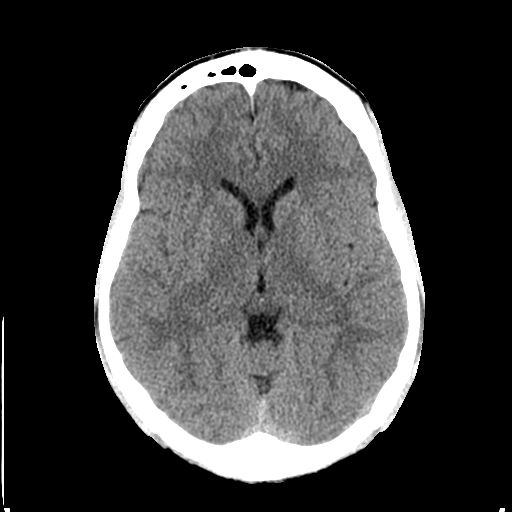
[im 16/31  brain]
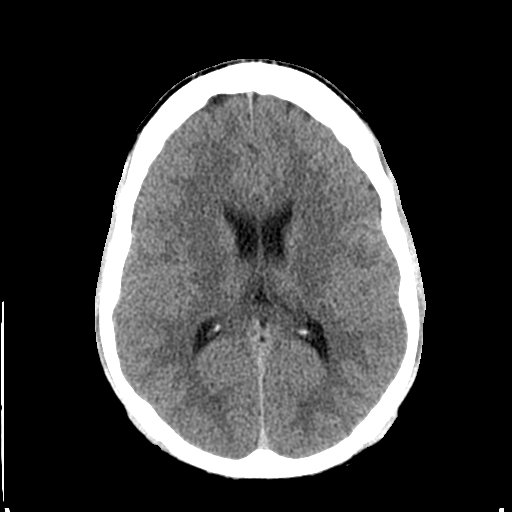
[im 16/31  bone]
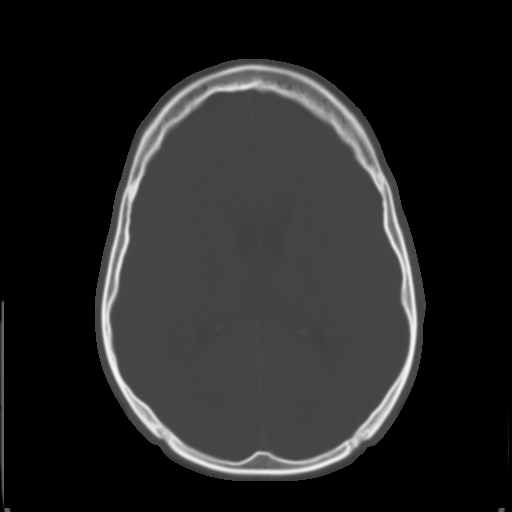
[im 18/31  brain]
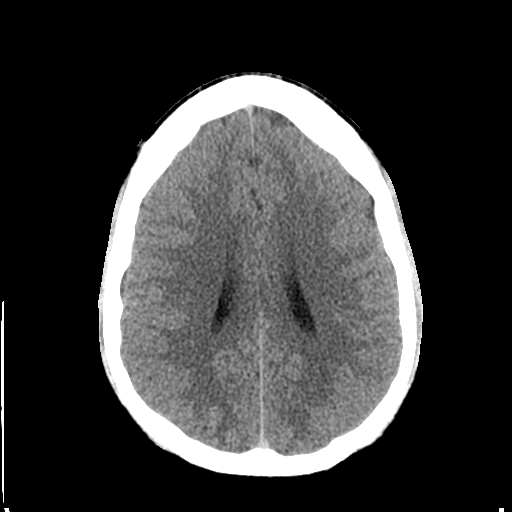
[im 22/31  brain]
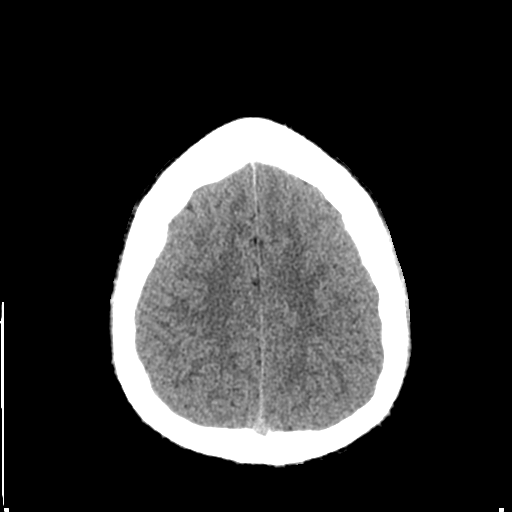
[im 24/31  brain]
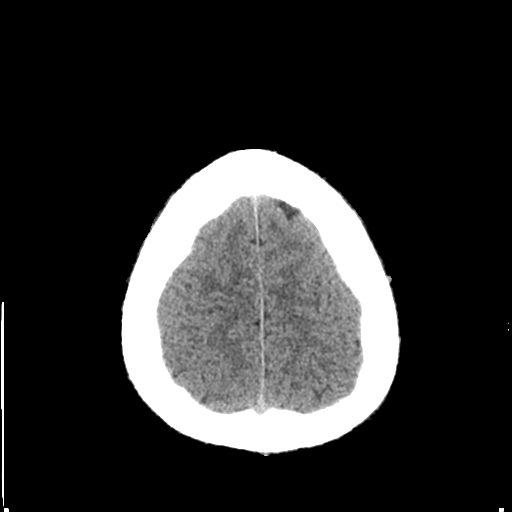
[im 28/31  brain]
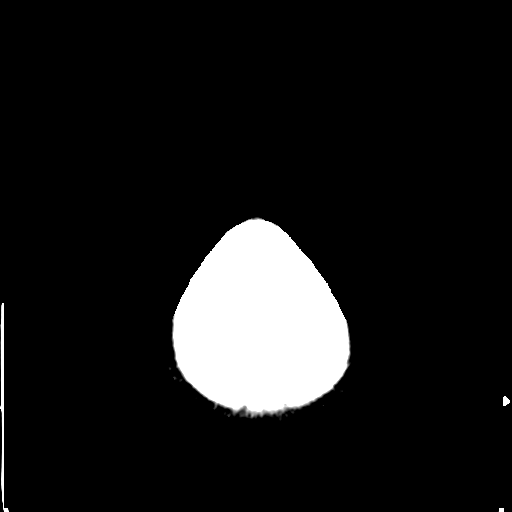
[im 28/31  bone]
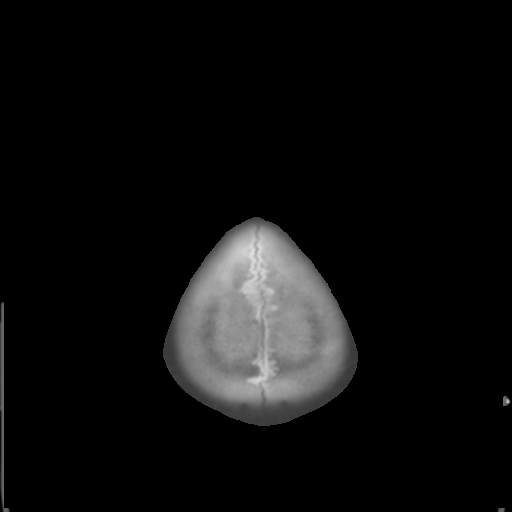

[Series 5: coronal · coronal · 0.31mm/px · 3 of 69 slices shown]
[im 23/69  brain]
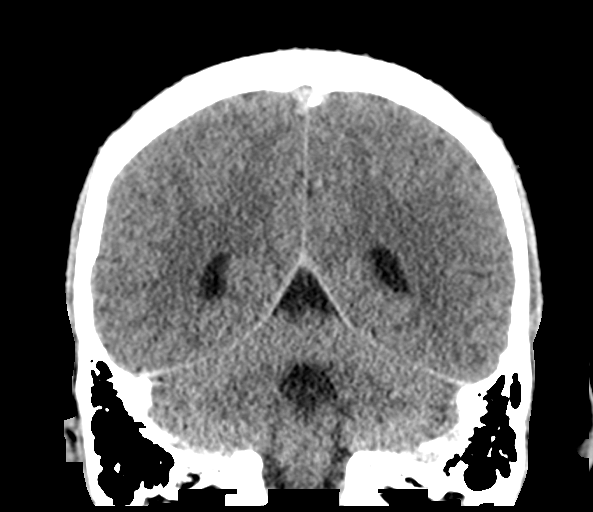
[im 31/69  brain]
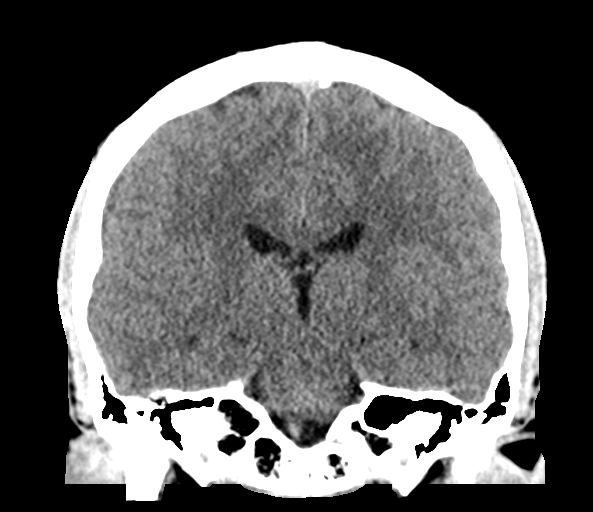
[im 38/69  brain]
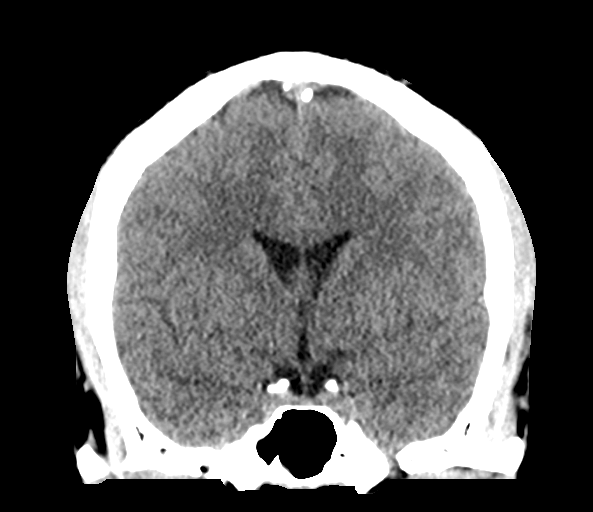

[Series 6: sagittal · sagittal · 0.31mm/px · 3 of 59 slices shown]
[im 20/59  brain]
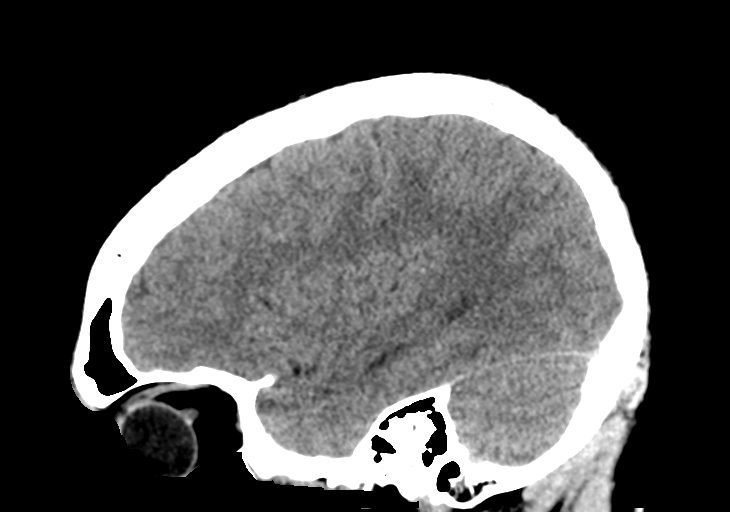
[im 30/59  brain]
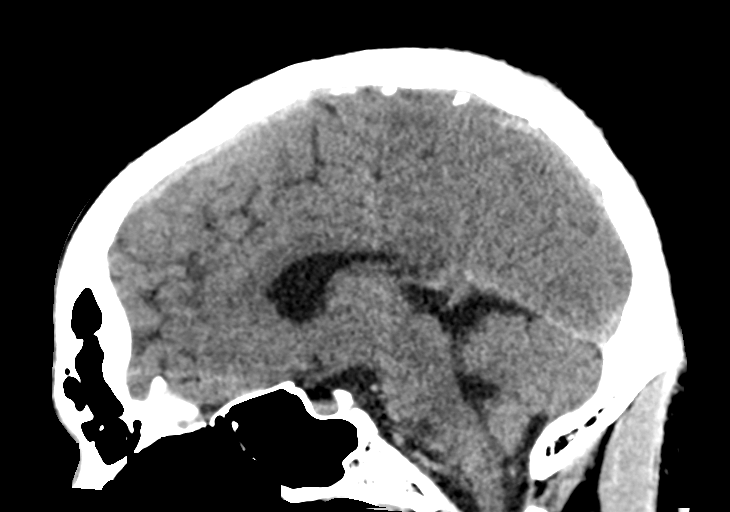
[im 39/59  brain]
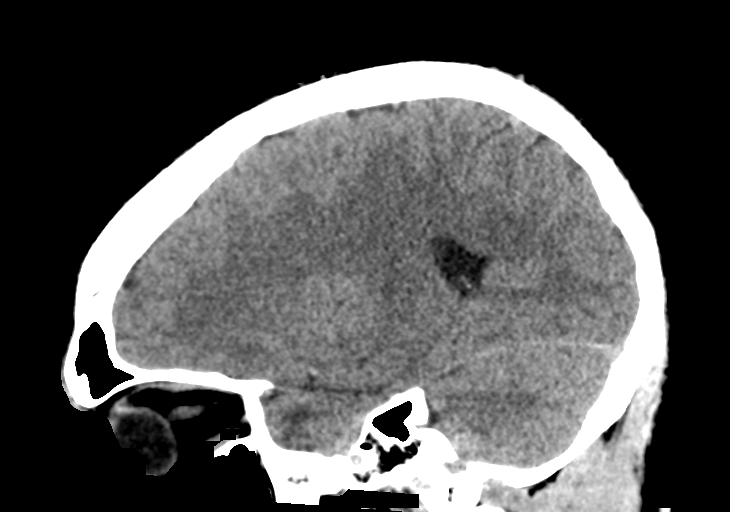

[15 of 47 positions shown; findings below may reference images not displayed]

FINDINGS: Brain: No evidence of acute infarction, hemorrhage, hydrocephalus,
extra-axial collection or mass lesion/mass effect.

Vascular: No hyperdense vessel or unexpected calcification.

Skull: Normal. Negative for fracture or focal lesion.

Sinuses/Orbits: Small left sphenoid sinus mucous retention cyst.
Otherwise negative.

Other: None.
IMPRESSION: No acute intracranial abnormality.  Unremarkable CT of the head.

By: Yefri Shang M.D.

## 2019-04-22 ENCOUNTER — Ambulatory Visit: Payer: Medicaid Other | Admitting: Family Medicine

## 2019-05-14 ENCOUNTER — Ambulatory Visit: Payer: Medicaid Other | Attending: Family Medicine | Admitting: Family Medicine

## 2019-05-14 ENCOUNTER — Encounter: Payer: Self-pay | Admitting: Family Medicine

## 2019-05-14 ENCOUNTER — Other Ambulatory Visit: Payer: Self-pay

## 2019-05-14 VITALS — BP 123/76 | HR 76 | Temp 98.3°F | Resp 18 | Ht 73.0 in | Wt 247.0 lb

## 2019-05-14 DIAGNOSIS — B360 Pityriasis versicolor: Secondary | ICD-10-CM | POA: Diagnosis not present

## 2019-05-14 DIAGNOSIS — Z0000 Encounter for general adult medical examination without abnormal findings: Secondary | ICD-10-CM

## 2019-05-14 DIAGNOSIS — H6690 Otitis media, unspecified, unspecified ear: Secondary | ICD-10-CM

## 2019-05-14 MED ORDER — AMOXICILLIN-POT CLAVULANATE 500-125 MG PO TABS: 1.0000 | ORAL_TABLET | Freq: Two times a day (BID) | ORAL | 0 refills | Status: DC

## 2019-05-14 MED ORDER — KETOCONAZOLE 2 % EX CREA: 1.0000 "application " | TOPICAL_CREAM | Freq: Every day | CUTANEOUS | 3 refills | Status: AC

## 2019-05-14 MED ORDER — CETIRIZINE HCL 5 MG PO TABS: 5.0000 mg | ORAL_TABLET | Freq: Every day | ORAL | 1 refills | Status: DC

## 2019-05-14 NOTE — Patient Instructions (Signed)
Otitis Media, Adult  Otitis media means that the middle ear is red and swollen (inflamed) and full of fluid. The condition usually goes away on its own. Follow these instructions at home:  Take over-the-counter and prescription medicines only as told by your doctor.  If you were prescribed an antibiotic medicine, take it as told by your doctor. Do not stop taking the antibiotic even if you start to feel better.  Keep all follow-up visits as told by your doctor. This is important. Contact a doctor if:  You have bleeding from your nose.  There is a lump on your neck.  You are not getting better in 5 days.  You feel worse instead of better. Get help right away if:  You have pain that is not helped with medicine.  You have swelling, redness, or pain around your ear.  You get a stiff neck.  You cannot move part of your face (paralyzed).  You notice that the bone behind your ear hurts when you touch it.  You get a very bad headache. Summary  Otitis media means that the middle ear is red, swollen, and full of fluid.  This condition usually goes away on its own. In some cases, treatment may be needed.  If you were prescribed an antibiotic medicine, take it as told by your doctor. This information is not intended to replace advice given to you by your health care provider. Make sure you discuss any questions you have with your health care provider. Document Revised: 12/07/2016 Document Reviewed: 01/16/2016 Elsevier Patient Education  2020 Elsevier Inc.  Tinea Versicolor  Tinea versicolor is a skin infection. It is caused by a type of yeast. It is normal for some yeast to be on your skin, but too much yeast causes this infection. The infection causes a rash of light or dark patches on your skin. The rash is most common on the chest, back, neck, or upper arms. The infection usually does not cause other problems. If it is treated, it will probably go away in a few weeks. The  infection cannot be spread from one person to another (is not contagious). Follow these instructions at home:  Use over-the-counter and prescription medicines only as told by your doctor.  Scrub your skin every day with dandruff shampoo as told by your doctor.  Do not scratch your skin in the rash area.  Avoid places that are hot and humid.  Do not use tanning booths.  Try to avoid sweating a lot. Contact a doctor if:  Your symptoms get worse.  You have a fever.  You have redness, swelling, or pain in the rash area.  You have fluid or blood coming from your rash.  Your rash feels warm to the touch.  You have pus or a bad smell coming from your rash.  Your rash comes back (recurs) after treatment. Summary  Tinea versicolor is a skin infection. It causes a rash of light or dark patches on your skin.  The rash is most common on the chest, back, neck, or upper arms. This infection usually does not cause other problems.  Use over-the-counter and prescription medicines only as told by your doctor.  If the infection is treated, it will probably go away in a few weeks. This information is not intended to replace advice given to you by your health care provider. Make sure you discuss any questions you have with your health care provider. Document Revised: 12/07/2016 Document Reviewed: 08/27/2016 Elsevier Patient Education  2020 Elsevier Inc.  

## 2019-05-14 NOTE — Progress Notes (Signed)
Bl ear pain X 1 wk / rash with itchy sensation back of the neck X 1 on and off   Pt states taking Invega injection prescribed by psychiatrist Md Crystal

## 2019-05-16 ENCOUNTER — Encounter: Payer: Self-pay | Admitting: Family Medicine

## 2019-05-16 NOTE — Progress Notes (Signed)
Subjective:  Patient ID: [name], male    DOB: 11-05-1990  Age: 29 y.o. MRN: [id number]  CC: Bilateral ear pain and rash  HPI [name], 29 year old male new to the practice, who reports that he has had pain in both ears off and on for about a week and ear pain is becoming more frequent and lasting longer.  He also has a pressure sensation in his ears and sound is sometimes muffled.  Ear pain can be dull but sometimes a sharp ache.  He denies fever or chills.  He has had some sensation of dull pressure across his forehead.  He feels as if he has some tenderness in his neck beneath both ears.  He has some sensation of drainage down the back of his throat.  He does not have a sore throat or difficulty swallowing.  No cough or shortness of breath.  No chest pain or palpitations.  No abdominal pain, no nausea or vomiting.  He also has a recurrent rash on his neck and upper back that has been present for more than a year and possibly several years.  Over-the-counter medications have not helped.  Past Medical History:  Diagnosis Date  . Allergic rhinitis    mostly spring  . Depression   . Schizophrenia (HCC)   . Strep throat 04/2013   obervation in ED due to high fever, illness    Past Surgical History:  Procedure Laterality Date  . ORCHIOPEXY     bilat, in childhood    Family History  Problem Relation Age of Onset  . Other Mother        "tumor of stomach"  . Anemia Mother   . Other Father        unknown  . Diabetes Brother        borderline  . Hypertension Maternal Grandmother   . Cancer Neg Hx   . Stroke Neg Hx   . Heart disease Neg Hx   . Aneurysm Neg Hx     Social History   Tobacco Use  . Smoking status: Former Games developer  . Smokeless tobacco: Never Used  Substance Use Topics  . Alcohol use: No    ROS Review of Systems  Constitutional: Negative for chills, fatigue and fever.  HENT: Positive for congestion, ear pain (Ear discomfort/pressure), postnasal  drip and sinus pressure (Forehead). Negative for ear discharge, sinus pain, sore throat and trouble swallowing.   Respiratory: Negative for cough and shortness of breath.   Cardiovascular: Negative for chest pain and palpitations.  Gastrointestinal: Negative for abdominal pain, constipation, diarrhea and nausea.  Genitourinary: Negative for dysuria and frequency.  Musculoskeletal: Negative for arthralgias and back pain.  Skin: Positive for rash. Negative for wound.  Neurological: Negative for dizziness and headaches.  Hematological: Negative for adenopathy. Does not bruise/bleed easily.  Psychiatric/Behavioral: Negative for self-injury and suicidal ideas.    Objective:   Today's Vitals: BP 123/76   Pulse 76   Temp 98.3 F (36.8 C)   Resp 18   Ht 6\' 1"  (1.854 m)   Wt 247 lb (112 kg)   SpO2 100%   BMI 32.59 kg/m   Physical Exam Vitals and nursing note reviewed.  Constitutional:      General: He is not in acute distress.    Appearance: Normal appearance. He is obese.  HENT:     Right Ear: Hearing, ear canal and external ear normal. Tympanic membrane is erythematous.     Left Ear: Hearing,  ear canal and external ear normal. Tympanic membrane is erythematous.     Nose: Mucosal edema, congestion and rhinorrhea present.     Right Turbinates: Enlarged and swollen.     Left Turbinates: Enlarged and swollen.     Right Sinus: No maxillary sinus tenderness or frontal sinus tenderness.     Left Sinus: No maxillary sinus tenderness or frontal sinus tenderness.     Mouth/Throat:     Pharynx: Posterior oropharyngeal erythema present. No oropharyngeal exudate or uvula swelling.     Comments: Posterior pharynx edema/erythema, no exudate Neck:     Vascular: No carotid bruit.     Comments: Mild superior cervical chain lymphadenopathy that is slightly tender to palpation Cardiovascular:     Rate and Rhythm: Normal rate and regular rhythm.  Pulmonary:     Effort: Pulmonary effort is normal.       Breath sounds: Normal breath sounds.  Abdominal:     Palpations: Abdomen is soft.     Tenderness: There is no abdominal tenderness. There is no guarding or rebound.  Musculoskeletal:     Cervical back: Normal range of motion and neck supple. Tenderness present.  Lymphadenopathy:     Cervical: Cervical adenopathy present.  Skin:    General: Skin is warm and dry.     Findings: Rash present. No erythema.     Comments: Patient with hyperpigmented rash on the posterior and lateral neck as well as scattered on the upper back  Neurological:     General: No focal deficit present.     Mental Status: He is alert and oriented to person, place, and time.  Psychiatric:        Mood and Affect: Mood normal.        Behavior: Behavior normal.     Assessment & Plan:   1. Acute otitis media, unspecified otitis media type Educational material on otitis media provided to the patient as part of after visit summary.  Patient has bilateral redness and thickening of the tympanic membranes right greater than left as well as some nasal congestion with clear drainage.  Prescription provided for Augmentin 500 mg twice daily x10 days to take after meal.  Prescription for cetirizine 5 mg at bedtime to help with congestion and hopefully the lower dose will not cause excessive sedation.  He should follow-up next week if he has continued ear pain and he may wish to go to urgent care this weekend if he has any acute worsening of symptoms while the office is closed. - amoxicillin-clavulanate (AUGMENTIN) 500-125 MG tablet; Take 1 tablet (500 mg total) by mouth 2 (two) times daily. To treat ear infection. Take after eating  Dispense: 20 tablet; Refill: 0 - cetirizine (ZYRTEC) 5 MG tablet; Take 1 tablet (5 mg total) by mouth daily. At night as needed for congestion  Dispense: 30 tablet; Refill: 1  2. Tinea versicolor His rash looks consistent with tinea versicolor for which he is being provided with a prescription for  ketoconazole cream to apply twice daily x2 weeks and then as needed.  He should call or return if his symptoms do not improve. - ketoconazole (NIZORAL) 2 % cream; Apply 1 application topically daily. To area of rash x 14 days then as needed  Dispense: 30 g; Refill: 3    Outpatient Encounter Medications as of 05/14/2019  Medication Sig  . amoxicillin-clavulanate (AUGMENTIN) 500-125 MG tablet Take 1 tablet (500 mg total) by mouth 2 (two) times daily. To treat ear infection. Take  after eating  . benztropine (COGENTIN) 0.5 MG tablet Take 1 tablet (0.5 mg total) by mouth 2 (two) times daily. For prevention of drug induced tremors. (Patient not taking: Reported on 05/14/2019)  . cetirizine (ZYRTEC) 5 MG tablet Take 1 tablet (5 mg total) by mouth daily. At night as needed for congestion  . hydrocortisone cream 1 % Apply topically 2 (two) times daily as needed for itching. (Patient not taking: Reported on 05/14/2019)  . ketoconazole (NIZORAL) 2 % cream Apply 1 application topically daily. To area of rash x 14 days then as needed  . nicotine (NICODERM CQ - DOSED IN MG/24 HOURS) 21 mg/24hr patch Place 1 patch (21 mg total) onto the skin daily. (May purchase from over the counter): For smoking cessation (Patient not taking: Reported on 05/14/2019)  . Prenatal Vit-Fe Fumarate-FA (PRENATAL MULTIVITAMIN) TABS tablet Take 1 tablet by mouth daily at 12 noon. (May buy from over the counter): For Vitamin replacement (Patient not taking: Reported on 05/14/2019)  . propranolol (INDERAL) 10 MG tablet Take 1 tablet (10 mg total) by mouth 2 (two) times daily. For anxiety (Patient not taking: Reported on 05/14/2019)  . risperiDONE (RISPERDAL) 2 MG tablet Take 1 tablet (2 mg total) by mouth 2 (two) times daily. For mood control (Patient not taking: Reported on 05/14/2019)  . traZODone (DESYREL) 100 MG tablet Take 1 tablet (100 mg total) by mouth at bedtime as needed for sleep. (Patient not taking: Reported on 05/14/2019)   No  facility-administered encounter medications on file as of 05/14/2019.    An After Visit Summary was printed and given to the patient.   Follow-up: Return for ear pain- next week if not better; call if rash is not better in 2 weeks.    Antony Blackbird MD

## 2019-05-28 ENCOUNTER — Ambulatory Visit: Payer: Medicaid Other | Attending: Family Medicine | Admitting: Family Medicine

## 2019-05-28 ENCOUNTER — Encounter: Payer: Self-pay | Admitting: Family Medicine

## 2019-05-28 ENCOUNTER — Other Ambulatory Visit: Payer: Self-pay

## 2019-05-28 VITALS — BP 124/84 | HR 72 | Temp 97.9°F | Ht 73.0 in | Wt 249.6 lb

## 2019-05-28 DIAGNOSIS — B360 Pityriasis versicolor: Secondary | ICD-10-CM

## 2019-05-28 DIAGNOSIS — Z87891 Personal history of nicotine dependence: Secondary | ICD-10-CM | POA: Diagnosis not present

## 2019-05-28 DIAGNOSIS — F329 Major depressive disorder, single episode, unspecified: Secondary | ICD-10-CM | POA: Insufficient documentation

## 2019-05-28 DIAGNOSIS — F209 Schizophrenia, unspecified: Secondary | ICD-10-CM | POA: Insufficient documentation

## 2019-05-28 DIAGNOSIS — H6693 Otitis media, unspecified, bilateral: Secondary | ICD-10-CM | POA: Insufficient documentation

## 2019-05-28 DIAGNOSIS — J309 Allergic rhinitis, unspecified: Secondary | ICD-10-CM

## 2019-05-28 DIAGNOSIS — H6983 Other specified disorders of Eustachian tube, bilateral: Secondary | ICD-10-CM | POA: Diagnosis present

## 2019-05-28 DIAGNOSIS — Z79899 Other long term (current) drug therapy: Secondary | ICD-10-CM | POA: Diagnosis not present

## 2019-05-28 MED ORDER — CETIRIZINE HCL 5 MG PO TABS: 10.0000 mg | ORAL_TABLET | Freq: Every day | ORAL | 3 refills | Status: DC

## 2019-05-28 MED ORDER — KETOCONAZOLE 2 % EX SHAM: 1.0000 "application " | MEDICATED_SHAMPOO | CUTANEOUS | 3 refills | Status: AC

## 2019-05-28 MED ORDER — CETIRIZINE HCL 5 MG PO TABS: 10.0000 mg | ORAL_TABLET | Freq: Every day | ORAL | 3 refills | Status: AC

## 2019-05-28 NOTE — Progress Notes (Signed)
Established Patient Office Visit  Subjective:  Patient ID: [name], male    DOB: 31-Dec-1990  Age: 29 y.o. MRN: [id number]  CC: Follow-up ear infection/rash-Treston Coker MD  HPI [name] presents for follow-up of allergic rhinitis and bilateral otitis media.  He reports that his right ear pain has completely resolved after antibiotic therapy at his last visit.  He continues to have discomfort/pressure in the left ear.  Overall he feels much better.  He still has some recurrent nasal congestion and occasional postnasal drainage.  No headaches or dizziness, no fever or chills, no sore throat or difficulty swallowing.  No shortness of breath or cough.  Rash from his last visit is also improving, less itchy.  Past Medical History:  Diagnosis Date  . Allergic rhinitis    mostly spring  . Depression   . Schizophrenia (HCC)   . Strep throat 04/2013   obervation in ED due to high fever, illness    Past Surgical History:  Procedure Laterality Date  . ORCHIOPEXY     bilat, in childhood    Family History  Problem Relation Age of Onset  . Other Mother        "tumor of stomach"  . Anemia Mother   . Other Father        unknown  . Diabetes Brother        borderline  . Hypertension Maternal Grandmother   . Cancer Neg Hx   . Stroke Neg Hx   . Heart disease Neg Hx   . Aneurysm Neg Hx     Social History   Socioeconomic History  . Marital status: Single    Spouse name: Not on file  . Number of children: Not on file  . Years of education: Not on file  . Highest education level: Not on file  Occupational History  . Not on file  Tobacco Use  . Smoking status: Former Games developer  . Smokeless tobacco: Never Used  Substance and Sexual Activity  . Alcohol use: No  . Drug use: Yes    Types: Marijuana  . Sexual activity: Yes    Birth control/protection: None  Other Topics Concern  . 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   Works part time  [employer].   Lives with parents, from [place], high school at [employer], [place] for a while, dropped out.  Wants to go into HVAC work.  Exercise - plays basketball regularly.  Currently trying to find work to [employer] at [employer] to return to school.   Prior incarceration x 14mo for theft.     Social Determinants of Health   Financial Resource Strain:   . Difficulty of Paying Living Expenses:   Food Insecurity:   . Worried About Programme researcher, broadcasting/film/video in the Last Year:   . Barista in the Last Year:   Transportation Needs:   . Freight forwarder (Medical):   Marland Kitchen Lack of Transportation (Non-Medical):   Physical Activity:   . Days of Exercise per Week:   . Minutes of Exercise per Session:   Stress:   . Feeling of Stress :   Social Connections:   . Frequency of Communication with Friends and Family:   . Frequency of Social Gatherings with Friends and Family:   . Attends Religious Services:   . Active Member of Clubs or Organizations:   . Attends Banker Meetings:   Marland Kitchen Marital  Status:   Intimate Partner Violence:   . Fear of Current or Ex-Partner:   . Emotionally Abused:   Marland Kitchen Physically Abused:   . Sexually Abused:     Outpatient Medications Prior to Visit  Medication Sig Dispense Refill  . ketoconazole (NIZORAL) 2 % cream Apply 1 application topically daily. To area of rash x 14 days then as needed 30 g 3  . amoxicillin-clavulanate (AUGMENTIN) 500-125 MG tablet Take 1 tablet (500 mg total) by mouth 2 (two) times daily. To treat ear infection. Take after eating (Patient not taking: Reported on 05/28/2019) 20 tablet 0  . benztropine (COGENTIN) 0.5 MG tablet Take 1 tablet (0.5 mg total) by mouth 2 (two) times daily. For prevention of drug induced tremors. (Patient not taking: Reported on 05/14/2019) 60 tablet 0  . hydrocortisone cream 1 % Apply topically 2 (two) times daily as needed for itching. (Patient not taking: Reported on 05/14/2019) 30 g 0  . nicotine  (NICODERM CQ - DOSED IN MG/24 HOURS) 21 mg/24hr patch Place 1 patch (21 mg total) onto the skin daily. (May purchase from over the counter): For smoking cessation (Patient not taking: Reported on 05/14/2019) 1 patch 0  . Prenatal Vit-Fe Fumarate-FA (PRENATAL MULTIVITAMIN) TABS tablet Take 1 tablet by mouth daily at 12 noon. (May buy from over the counter): For Vitamin replacement (Patient not taking: Reported on 05/14/2019)    . propranolol (INDERAL) 10 MG tablet Take 1 tablet (10 mg total) by mouth 2 (two) times daily. For anxiety (Patient not taking: Reported on 05/14/2019) 60 tablet 0  . risperiDONE (RISPERDAL) 2 MG tablet Take 1 tablet (2 mg total) by mouth 2 (two) times daily. For mood control (Patient not taking: Reported on 05/14/2019) 60 tablet 0  . traZODone (DESYREL) 100 MG tablet Take 1 tablet (100 mg total) by mouth at bedtime as needed for sleep. (Patient not taking: Reported on 05/14/2019) 30 tablet 0  . cetirizine (ZYRTEC) 5 MG tablet Take 1 tablet (5 mg total) by mouth daily. At night as needed for congestion (Patient not taking: Reported on 05/28/2019) 30 tablet 1   No facility-administered medications prior to visit.    No Known Allergies  ROS Review of Systems  Constitutional: Negative for chills, fatigue and fever.  HENT: Positive for congestion and postnasal drip. Negative for ear pain (Ear pressure but no pain), rhinorrhea, sinus pressure, sinus pain, sore throat and trouble swallowing.   Eyes: Negative for discharge and itching.  Respiratory: Negative for cough and shortness of breath.   Gastrointestinal: Negative for abdominal pain, constipation, diarrhea and nausea.  Endocrine: Negative for polydipsia, polyphagia and polyuria.  Musculoskeletal: Negative for arthralgias and back pain.  Skin: Positive for rash (Improved). Negative for wound.  Neurological: Negative for dizziness and headaches.  Hematological: Negative for adenopathy. Does not bruise/bleed easily.        Objective:    Physical Exam  Constitutional: He is oriented to person, place, and time. He appears well-developed and well-nourished.  General-well-nourished well-developed overweight for height young adult male in no acute distress, wearing mask as per office COVID-19 protocol  HENT:  TMs are dull and patient with slight subacute fluid level behind inferior eardrums bilaterally.  Pale edematous nasal turbinates with slight clear to white discharge, mild posterior pharynx erythema with cobblestoning  Cardiovascular: Normal rate and regular rhythm.  Pulmonary/Chest: Effort normal and breath sounds normal.  Musculoskeletal:     Cervical back: Normal range of motion and neck supple.  Lymphadenopathy:  He has no cervical adenopathy.  Neurological: He is alert and oriented to person, place, and time.  Skin: Skin is warm and dry. Rash (Hyperpigmented and slightly hypertrophic appearing skin on the posterior lateral neck and scattered areas of hyperpigmented skin on the upper back) noted.  Psychiatric: He has a normal mood and affect. His behavior is normal.  Nursing note and vitals reviewed.   BP 124/84   Pulse 72   Temp 97.9 F (36.6 C) (Temporal)   Ht 6\' 1"  (1.854 m)   Wt 249 lb 9.6 oz (113.2 kg)   SpO2 96%   BMI 32.93 kg/m  Wt Readings from Last 3 Encounters:  05/28/19 249 lb 9.6 oz (113.2 kg)  05/14/19 247 lb (112 kg)  04/11/17 200 lb (90.7 kg)     Health Maintenance Due  Topic Date Due  . COVID-19 Vaccine (1) Never done  . TETANUS/TDAP  04/21/2017     Lab Results  Component Value Date   TSH 1.068 04/13/2017   Lab Results  Component Value Date   WBC 5.3 12/08/2017   HGB 13.8 12/08/2017   HCT 42.9 12/08/2017   MCV 89.6 12/08/2017   PLT 284 12/08/2017   Lab Results  Component Value Date   NA 138 12/08/2017   K 3.2 (L) 12/08/2017   CO2 25 12/08/2017   GLUCOSE 87 12/08/2017   BUN 12 12/08/2017   CREATININE 1.10 12/08/2017   BILITOT 1.0 12/07/2017   ALKPHOS  61 12/07/2017   AST 45 (H) 12/07/2017   ALT 40 12/07/2017   PROT 7.8 12/07/2017   ALBUMIN 5.0 12/07/2017   CALCIUM 9.0 12/08/2017   ANIONGAP 10 12/08/2017   Lab Results  Component Value Date   CHOL 199 04/13/2017   Lab Results  Component Value Date   HDL 45 04/13/2017   Lab Results  Component Value Date   LDLCALC 140 (H) 04/13/2017   Lab Results  Component Value Date   TRIG 71 04/13/2017   Lab Results  Component Value Date   CHOLHDL 4.4 04/13/2017   Lab Results  Component Value Date   HGBA1C 5.5 04/13/2017      Assessment & Plan:  1. Dysfunction of both eustachian tubes; allergic rhinitis Information provided regarding eustachian tube dysfunction as part of after visit summary.  Continue use of Zyrtec which has been increased from 5 mg to 10 mg nightly to help with allergic rhinitis and sensation of ear pressure/eustachian tube dysfunction.  Zyrtec was initially written as a lower dose as patient was on trazodone but reports that he is no longer taking this medication. - cetirizine (ZYRTEC) 5 MG tablet; Take 2 tablets (10 mg total) by mouth daily. At night as needed for congestion  Dispense: 30 tablet; Refill: 3   3. Tinea versicolor Patient with continued discoloration/hyperpigmentation of the posterior neck area and upper back areas consistent with tinea versicolor.  Will additionally at some point also check patient's blood sugar and possible hemoglobin A1c to make sure that he does not also have skin changes associated with insulin resistance/diabetes.  He does improve that the rash is improved and less itchy.  He may continue the use of ketoconazole cream as needed and also prescription sent to pharmacy for ketoconazole shampoo that he may apply twice per week, let dry and wash off to help with the tinea versicolor. - ketoconazole (NIZORAL) 2 % shampoo; Apply 1 application topically 2 (two) times a week. To area of itchy/discolored skin, let dry then wash off.  Use as  needed  Dispense: 120 mL; Refill: 3   Meds ordered this encounter  Medications  . cetirizine (ZYRTEC) 5 MG tablet    Sig: Take 2 tablets (10 mg total) by mouth daily. At night as needed for congestion    Dispense:  30 tablet    Refill:  3    An After Visit Summary was printed and given to the patient.   Follow-up: Return for Ear pain-prn and yearly exam when needed .    Antony Blackbird, MD

## 2019-05-28 NOTE — Progress Notes (Signed)
Per pt he is feeling much better and this is just a 2wks f/u
